# Patient Record
Sex: Male | Born: 1955 | Race: Black or African American | Hispanic: No | Smoking: Never smoker
Health system: Southern US, Community
[De-identification: ages and names within clinical notes are randomized; demographics above are authoritative.]

## PROBLEM LIST (undated history)

## (undated) DIAGNOSIS — I10 Essential (primary) hypertension: Secondary | ICD-10-CM

## (undated) DIAGNOSIS — C61 Malignant neoplasm of prostate: Secondary | ICD-10-CM

## (undated) HISTORY — PX: OTHER SURGICAL HISTORY: SHX169

## (undated) HISTORY — PX: PROSTATE BIOPSY: SHX241

## (undated) HISTORY — PX: EXTRACORPOREAL SHOCK WAVE LITHOTRIPSY: SHX1557

---

## 1998-07-02 ENCOUNTER — Encounter: Payer: Self-pay | Admitting: Internal Medicine

## 1998-07-02 ENCOUNTER — Ambulatory Visit (HOSPITAL_COMMUNITY): Admission: RE | Admit: 1998-07-02 | Discharge: 1998-07-02 | Payer: Self-pay | Admitting: Internal Medicine

## 1999-04-22 ENCOUNTER — Encounter: Payer: Self-pay | Admitting: Internal Medicine

## 1999-04-22 ENCOUNTER — Encounter: Admission: RE | Admit: 1999-04-22 | Discharge: 1999-04-22 | Payer: Self-pay | Admitting: Internal Medicine

## 2000-05-05 ENCOUNTER — Ambulatory Visit (HOSPITAL_COMMUNITY): Admission: RE | Admit: 2000-05-05 | Discharge: 2000-05-05 | Payer: Self-pay | Admitting: Urology

## 2000-12-06 ENCOUNTER — Encounter: Admission: RE | Admit: 2000-12-06 | Discharge: 2000-12-06 | Payer: Self-pay | Admitting: Internal Medicine

## 2000-12-06 ENCOUNTER — Encounter: Payer: Self-pay | Admitting: Internal Medicine

## 2002-10-28 ENCOUNTER — Encounter: Payer: Self-pay | Admitting: Emergency Medicine

## 2002-10-28 ENCOUNTER — Emergency Department (HOSPITAL_COMMUNITY): Admission: EM | Admit: 2002-10-28 | Discharge: 2002-10-28 | Payer: Self-pay | Admitting: Emergency Medicine

## 2009-11-14 ENCOUNTER — Encounter: Admission: RE | Admit: 2009-11-14 | Discharge: 2009-11-14 | Payer: Self-pay | Admitting: Internal Medicine

## 2009-11-26 ENCOUNTER — Emergency Department (HOSPITAL_COMMUNITY): Admission: EM | Admit: 2009-11-26 | Discharge: 2009-11-26 | Payer: Self-pay | Admitting: Emergency Medicine

## 2010-04-07 LAB — URINE CULTURE
Culture  Setup Time: 201111021402
Culture: NO GROWTH

## 2010-04-07 LAB — URINALYSIS, ROUTINE W REFLEX MICROSCOPIC
Bilirubin Urine: NEGATIVE
Glucose, UA: 500 mg/dL — AB
Leukocytes, UA: NEGATIVE
Nitrite: NEGATIVE
Protein, ur: NEGATIVE mg/dL
Specific Gravity, Urine: 1.024 (ref 1.005–1.030)
Urobilinogen, UA: 0.2 mg/dL (ref 0.0–1.0)
pH: 6 (ref 5.0–8.0)

## 2010-04-07 LAB — URINE MICROSCOPIC-ADD ON

## 2016-02-20 DIAGNOSIS — C61 Malignant neoplasm of prostate: Secondary | ICD-10-CM | POA: Diagnosis not present

## 2016-02-20 DIAGNOSIS — E782 Mixed hyperlipidemia: Secondary | ICD-10-CM | POA: Diagnosis not present

## 2016-02-20 DIAGNOSIS — E139 Other specified diabetes mellitus without complications: Secondary | ICD-10-CM | POA: Diagnosis not present

## 2016-02-20 DIAGNOSIS — I10 Essential (primary) hypertension: Secondary | ICD-10-CM | POA: Diagnosis not present

## 2016-02-20 DIAGNOSIS — Z0001 Encounter for general adult medical examination with abnormal findings: Secondary | ICD-10-CM | POA: Diagnosis not present

## 2016-02-20 DIAGNOSIS — D81818 Other biotin-dependent carboxylase deficiency: Secondary | ICD-10-CM | POA: Diagnosis not present

## 2016-02-20 DIAGNOSIS — E559 Vitamin D deficiency, unspecified: Secondary | ICD-10-CM | POA: Diagnosis not present

## 2016-02-20 DIAGNOSIS — Z125 Encounter for screening for malignant neoplasm of prostate: Secondary | ICD-10-CM | POA: Diagnosis not present

## 2016-02-20 DIAGNOSIS — Z79899 Other long term (current) drug therapy: Secondary | ICD-10-CM | POA: Diagnosis not present

## 2016-03-18 DIAGNOSIS — E139 Other specified diabetes mellitus without complications: Secondary | ICD-10-CM | POA: Diagnosis not present

## 2016-03-18 DIAGNOSIS — C61 Malignant neoplasm of prostate: Secondary | ICD-10-CM | POA: Diagnosis not present

## 2016-03-18 DIAGNOSIS — E782 Mixed hyperlipidemia: Secondary | ICD-10-CM | POA: Diagnosis not present

## 2016-03-18 DIAGNOSIS — I10 Essential (primary) hypertension: Secondary | ICD-10-CM | POA: Diagnosis not present

## 2016-04-13 DIAGNOSIS — E119 Type 2 diabetes mellitus without complications: Secondary | ICD-10-CM | POA: Diagnosis not present

## 2016-08-02 DIAGNOSIS — R972 Elevated prostate specific antigen [PSA]: Secondary | ICD-10-CM | POA: Diagnosis not present

## 2016-08-02 DIAGNOSIS — C61 Malignant neoplasm of prostate: Secondary | ICD-10-CM | POA: Diagnosis not present

## 2016-08-02 DIAGNOSIS — Z87442 Personal history of urinary calculi: Secondary | ICD-10-CM | POA: Diagnosis not present

## 2016-09-16 DIAGNOSIS — E139 Other specified diabetes mellitus without complications: Secondary | ICD-10-CM | POA: Diagnosis not present

## 2016-09-16 DIAGNOSIS — N200 Calculus of kidney: Secondary | ICD-10-CM | POA: Diagnosis not present

## 2016-09-16 DIAGNOSIS — I10 Essential (primary) hypertension: Secondary | ICD-10-CM | POA: Diagnosis not present

## 2016-09-16 DIAGNOSIS — E782 Mixed hyperlipidemia: Secondary | ICD-10-CM | POA: Diagnosis not present

## 2016-10-28 DIAGNOSIS — Z23 Encounter for immunization: Secondary | ICD-10-CM | POA: Diagnosis not present

## 2017-01-31 DIAGNOSIS — C61 Malignant neoplasm of prostate: Secondary | ICD-10-CM | POA: Diagnosis not present

## 2017-02-07 DIAGNOSIS — R972 Elevated prostate specific antigen [PSA]: Secondary | ICD-10-CM | POA: Diagnosis not present

## 2017-02-07 DIAGNOSIS — C61 Malignant neoplasm of prostate: Secondary | ICD-10-CM | POA: Diagnosis not present

## 2017-02-07 DIAGNOSIS — N200 Calculus of kidney: Secondary | ICD-10-CM | POA: Diagnosis not present

## 2017-02-08 ENCOUNTER — Other Ambulatory Visit: Payer: Self-pay | Admitting: Urology

## 2017-02-08 DIAGNOSIS — C61 Malignant neoplasm of prostate: Secondary | ICD-10-CM

## 2017-02-24 DIAGNOSIS — Z79899 Other long term (current) drug therapy: Secondary | ICD-10-CM | POA: Diagnosis not present

## 2017-02-24 DIAGNOSIS — E559 Vitamin D deficiency, unspecified: Secondary | ICD-10-CM | POA: Diagnosis not present

## 2017-02-24 DIAGNOSIS — Z0000 Encounter for general adult medical examination without abnormal findings: Secondary | ICD-10-CM | POA: Diagnosis not present

## 2017-02-24 DIAGNOSIS — E782 Mixed hyperlipidemia: Secondary | ICD-10-CM | POA: Diagnosis not present

## 2017-02-24 DIAGNOSIS — D81818 Other biotin-dependent carboxylase deficiency: Secondary | ICD-10-CM | POA: Diagnosis not present

## 2017-02-24 DIAGNOSIS — I10 Essential (primary) hypertension: Secondary | ICD-10-CM | POA: Diagnosis not present

## 2017-02-24 DIAGNOSIS — M109 Gout, unspecified: Secondary | ICD-10-CM | POA: Diagnosis not present

## 2017-02-24 DIAGNOSIS — E139 Other specified diabetes mellitus without complications: Secondary | ICD-10-CM | POA: Diagnosis not present

## 2017-03-14 ENCOUNTER — Other Ambulatory Visit: Payer: Self-pay

## 2017-03-21 ENCOUNTER — Other Ambulatory Visit: Payer: Self-pay | Admitting: Urology

## 2017-03-21 ENCOUNTER — Ambulatory Visit
Admission: RE | Admit: 2017-03-21 | Discharge: 2017-03-21 | Disposition: A | Discharge status: Home / Self Care | Payer: 59 | Source: Ambulatory Visit | Attending: Urology | Admitting: Urology

## 2017-03-21 DIAGNOSIS — S40859A Superficial foreign body of unspecified upper arm, initial encounter: Secondary | ICD-10-CM

## 2017-03-21 DIAGNOSIS — Z135 Encounter for screening for eye and ear disorders: Secondary | ICD-10-CM | POA: Diagnosis not present

## 2017-03-21 DIAGNOSIS — C61 Malignant neoplasm of prostate: Secondary | ICD-10-CM

## 2017-03-21 MED ORDER — GADOBENATE DIMEGLUMINE 529 MG/ML IV SOLN
20.0000 mL | Freq: Once | INTRAVENOUS | Status: AC | PRN
  Administered 2017-03-21: 20 mL via INTRAVENOUS

## 2017-05-10 DIAGNOSIS — R109 Unspecified abdominal pain: Secondary | ICD-10-CM | POA: Diagnosis not present

## 2017-06-02 DIAGNOSIS — C61 Malignant neoplasm of prostate: Secondary | ICD-10-CM | POA: Diagnosis not present

## 2017-06-13 ENCOUNTER — Encounter: Payer: Self-pay | Admitting: Medical Oncology

## 2017-06-13 ENCOUNTER — Telehealth: Payer: Self-pay | Admitting: Medical Oncology

## 2017-06-13 NOTE — Telephone Encounter (Signed)
Left message requesting a return call to discuss referral to the Prostate [place].

## 2017-06-13 NOTE — Telephone Encounter (Signed)
Mr. Carlos Weber returned my call regarding referral to the Prostate [place]. I introduced myself as the Prostate Nurse Navigator and the Coordinator of the Prostate [place].  1. I confirmed with the patient he is aware of his referral to the clinic 06/21/17. He states he received a call from Dr. Ralene Muskrat office Friday but he has not return the call so he is sure is was to inform him of referral. I asked him to arrive at 12:30 pm.   2. I discussed the format of the clinic and the physicians he will be seeing that day.  3. I discussed where the clinic is located and how to contact me.  4. I confirmed his address and informed him I would be mailing a packet of information and forms to be completed. I asked him to bring them with him the day of his appointment.   He voiced understanding of the above. I asked him to call me if he has any questions or concerns regarding his appointments or the forms he needs to complete.

## 2017-06-17 ENCOUNTER — Encounter: Payer: Self-pay | Admitting: Radiation Oncology

## 2017-06-17 ENCOUNTER — Other Ambulatory Visit: Payer: Self-pay | Admitting: Nurse Practitioner

## 2017-06-17 ENCOUNTER — Ambulatory Visit
Admission: RE | Admit: 2017-06-17 | Discharge: 2017-06-17 | Disposition: A | Discharge status: Home / Self Care | Payer: 59 | Source: Ambulatory Visit | Attending: Nurse Practitioner | Admitting: Nurse Practitioner

## 2017-06-17 DIAGNOSIS — M542 Cervicalgia: Secondary | ICD-10-CM

## 2017-06-17 DIAGNOSIS — I10 Essential (primary) hypertension: Secondary | ICD-10-CM | POA: Diagnosis not present

## 2017-06-17 NOTE — Progress Notes (Signed)
GU Location of Tumor / Histology: prostatic adenocarcinoma  If Prostate Cancer, Gleason Score is (4 + 3) and PSA is (12.3). Prostate volume: 28 grams.  OWYN RAULSTON has been under active surveillance since 2012.  Biopsies of prostate (if applicable) revealed:    Past/Anticipated interventions by urology, if any: prostate biopsy, active surveillance, prostate biopsy, referral to [facility]  Past/Anticipated interventions by medical oncology, if any: no  Weight changes, if any: no  Bowel/Bladder complaints, if any: IPSS 4  Nausea/Vomiting, if any: no  Pain issues, if any:  No   SAFETY ISSUES:  Prior radiation? no  Pacemaker/ICD? no  Possible current pregnancy? no  Is the patient on methotrexate? no  Current Complaints / other details:  62 year old male. Divorced. Work as a Production manager.

## 2017-06-21 ENCOUNTER — Encounter: Payer: Self-pay | Admitting: Medical Oncology

## 2017-06-21 ENCOUNTER — Ambulatory Visit
Admission: RE | Admit: 2017-06-21 | Discharge: 2017-06-21 | Disposition: A | Discharge status: Home / Self Care | Payer: 59 | Source: Ambulatory Visit | Attending: Radiation Oncology | Admitting: Radiation Oncology

## 2017-06-21 ENCOUNTER — Other Ambulatory Visit: Payer: Self-pay

## 2017-06-21 ENCOUNTER — Inpatient Hospital Stay: Payer: 59 | Attending: Oncology | Admitting: Oncology

## 2017-06-21 ENCOUNTER — Encounter: Payer: Self-pay | Admitting: Radiation Oncology

## 2017-06-21 ENCOUNTER — Telehealth: Payer: Self-pay | Admitting: Medical Oncology

## 2017-06-21 ENCOUNTER — Encounter: Payer: Self-pay | Admitting: General Practice

## 2017-06-21 DIAGNOSIS — Z79899 Other long term (current) drug therapy: Secondary | ICD-10-CM | POA: Diagnosis not present

## 2017-06-21 DIAGNOSIS — Z7982 Long term (current) use of aspirin: Secondary | ICD-10-CM | POA: Insufficient documentation

## 2017-06-21 DIAGNOSIS — Z7984 Long term (current) use of oral hypoglycemic drugs: Secondary | ICD-10-CM | POA: Diagnosis not present

## 2017-06-21 DIAGNOSIS — C61 Malignant neoplasm of prostate: Secondary | ICD-10-CM | POA: Insufficient documentation

## 2017-06-21 DIAGNOSIS — R972 Elevated prostate specific antigen [PSA]: Secondary | ICD-10-CM | POA: Diagnosis not present

## 2017-06-21 DIAGNOSIS — I10 Essential (primary) hypertension: Secondary | ICD-10-CM | POA: Insufficient documentation

## 2017-06-21 HISTORY — DX: Essential (primary) hypertension: I10

## 2017-06-21 HISTORY — DX: Malignant neoplasm of prostate: C61

## 2017-06-21 NOTE — Telephone Encounter (Signed)
Spoke with Carlos Weber to remind him of appointment for [facility]state [facility] today arriving at 12:30 pm. I reviewed the location, valet parking, registration and reminded him to bring his completed medical forms. He voiced understanding.

## 2017-06-21 NOTE — Progress Notes (Signed)
[place] Psychosocial Distress Screening Spiritual Care  Met with Marcello Moores and Renard Matter in [facility] to introduce [facility] team/resources, reviewing distress screen per protocol.  The patient scored a [unspecified] on the Psychosocial Distress Thermometer which indicates [unspecified] distress. Also assessed for distress and other psychosocial needs.   ONCBCN DISTRESS SCREENING 06/21/2017  Screening Type Initial Screening  Referral to support programs Yes    Mr Arnesen reviewed distress screen but did not complete his paper copy. Per pt, he likes to ask all questions before making a decision. He appeared anxious in facial affect and vibrating physical motions.  Normalized feelings of distress and fear/concern about unknown. Encouraged Prostate Cancer Support Group as a means for him and SO to meet other patients/partners who are coping with prostate ca and various treatment paths/experiences. Encouraged support team/programming resources.   Follow up needed: No. Per couple, no other needs at this time, but please page if immediate needs arise or circumstances change. Thank you.   [place], [place], [facility] Pager [phone] Voicemail [phone]

## 2017-06-21 NOTE — Progress Notes (Signed)
                               Care Plan Summary  Name: Mr. Carlos Weber DOB: [birth year]   Your Medical Team:   Urologist -  Dr. Raynelle Bring, [facility]  Radiation Oncologist - Dr. Tyler Pita, [facility]   Medical Oncologist - Dr. Zola Button, [place]  Recommendations: 1) Robotic prostatectomy 2) Brachytherapy (seed implant) 3) Radiation  * These recommendations are based on information available as of today's consult.      Recommendations may change depending on the results of further tests or exams.   Next Steps: 1) Consider your options and call Cira Rue, RN with your decision   When appointments need to be scheduled, you will be contacted by [facility] and/or [facility].  Provided with business cards for all team members and a copy of "Fall [place] Patient Safety Plan".  Questions?  Please do not hesitate to call Cira Rue, RN, BSN, OCN at [phone] any questions or concerns.  Shirlean Mylar is your Oncology Nurse Navigator and is available to assist you while you're receiving your medical care at [facility].

## 2017-06-21 NOTE — Progress Notes (Signed)
Radiation Oncology         [phone] ________________________________  Multidisciplinary Prostate Cancer Clinic  Initial Radiation Oncology Consultation  Name: Carlos Weber MRN: [id number]  Date: 06/21/2017  DOB: [birth year]  UV:[id number], Carlos [place], MD  Raynelle Bring, MD   REFERRING PHYSICIAN: Raynelle Bring, MD  DIAGNOSIS: 62 y.o. gentleman with stage T1c adenocarcinoma of the prostate with a Gleason's score of 4+3 and a PSA of 12.30    ICD-10-CM   1. Malignant neoplasm of prostate ([place]) C61     HISTORY OF PRESENT ILLNESS::Carlos Weber is a 62 y.o. gentleman.  He is an established patient of Dr. Irine Seal followed in active surveillance for a Gleason 3+3, T1c adenocarcinoma of the prostate originally diagnosed in 2012 with a PSA of 4.8.  He has remained compliant on active surveillance since the time of his diagnosis and has had a PSA as high as 11.51 in January 2018 but declined further evaluation with MRI which was recommended at that time.  A repeat PSA in July 2018 had fallen to 9.28 but a recent PSA in January 2019 was further elevated at 12.30.  Therefore, the patient proceeded to MRI of the prostate on 03/21/2017 for further evaluation and this revealed a 2.3 cm PI-RADS 5 lesion at the right apex.  The lesion appears to abut the capsule but there is no definite evidence of extraprostatic extension, seminal vesicle involvement or metastatic disease.  The patient proceeded to MRI fusion transrectal ultrasound with 12 biopsies of the prostate on 06/02/17.  The prostate volume measured 28 cc.  Out of 12 core biopsies,3 were positive.  The maximum Gleason score was 4+3, and this was seen in the right apex.  Additionally, there was Gleason 3+4 disease in the right mid lateral and right apex lateral..  The patient reviewed the biopsy results with his urologist and he has kindly been referred today to the multidisciplinary prostate cancer clinic for presentation of pathology and radiology  studies in our conference for discussion of potential radiation treatment options and clinical evaluation.  PREVIOUS RADIATION THERAPY: No  PAST MEDICAL HISTORY:  has a past medical history of Hypertension and Prostate cancer ([place]).    PAST SURGICAL HISTORY: Past Surgical History:  Procedure Laterality Date  . EXTRACORPOREAL SHOCK WAVE LITHOTRIPSY    . kidney stents    . PROSTATE BIOPSY    . PROSTATE BIOPSY      FAMILY HISTORY: family history is not on file.  SOCIAL HISTORY:  reports that he has never smoked. He has never used smokeless tobacco. He reports that he drinks alcohol. He reports that he does not use drugs.  ALLERGIES: Amoxicillin; Polycitra-k [potassium citrate]; Sulfa antibiotics; Toprol xl [metoprolol tartrate]; Vicodin [hydrocodone-acetaminophen]; and Zetia [ezetimibe]  MEDICATIONS:  Current Outpatient Medications  Medication Sig Dispense Refill  . aspirin EC 81 MG tablet Take 81 mg by mouth daily.    . Cholecalciferol (VITAMIN D3) 2000 units TABS Take by mouth.    Mar[place] Kitchen lisinopril (PRINIVIL,ZESTRIL) 5 MG tablet Take 5 mg by mouth daily.    . metFORMIN (GLUCOPHAGE) 500 MG tablet Take 500 mg by mouth 2 (two) times daily.  2  . cyclobenzaprine (FLEXERIL) 10 MG tablet 1 TABLET AS NEEDED THREE TIMES A DAY ORALLY 5 DAYS  0  . predniSONE (STERAPRED UNI-PAK 21 TAB) 10 MG (21) TBPK tablet TAKE 6 TABLETS ON DAY 1 AS DIRECTED ON PACKAGE AND DECREASE BY 1 TAB EACH DAY FOR A TOTAL OF 6 DAYS  0  No current facility-administered medications for this encounter.     REVIEW OF SYSTEMS:  On review of systems, the patient reports that he is doing well overall. He denies any chest pain, shortness of breath, cough, fevers, chills, night sweats, unintended weight changes. He denies any bowel disturbances, and denies abdominal pain, nausea or vomiting. He denies any new musculoskeletal or joint aches or pains. His IPSS was 4, indicating mild urinary symptoms. He is able to complete sexual  activity with most all attempts. A complete review of systems is obtained and is otherwise negative.   PHYSICAL EXAM:  Wt Readings from Last 3 Encounters:  06/21/17 226 lb (102.5 kg)   Temp Readings from Last 3 Encounters:  06/21/17 98.3 F (36.8 C) (Oral)   BP Readings from Last 3 Encounters:  06/21/17 (!) 159/97   Pulse Readings from Last 3 Encounters:  06/21/17 96   Pain Assessment Pain Score: 0-No pain/10  In general this is a well appearing Afican American male in no acute distress. He is alert and oriented x4 and appropriate throughout the examination. HEENT reveals that the patient is normocephalic, atraumatic. EOMs are intact. PERRLA. Skin is intact without any evidence of gross lesions. Cardiovascular exam reveals a regular rate and rhythm, no clicks rubs or murmurs are auscultated. Chest is clear to auscultation bilaterally. Lymphatic assessment is performed and does not reveal any adenopathy in the cervical, supraclavicular, axillary, or inguinal chains. Abdomen has active bowel sounds in all quadrants and is intact. The abdomen is soft, non tender, non distended. Lower extremities are negative for pretibial pitting edema, deep calf tenderness, cyanosis or clubbing.  KPS = 100  100 - Normal; no complaints; no evidence of disease. 90   - Able to carry on normal activity; minor signs or symptoms of disease. 80   - Normal activity with effort; some signs or symptoms of disease. 19   - Cares for self; unable to carry on normal activity or to do active work. 60   - Requires occasional assistance, but is able to care for most of his personal needs. 50   - Requires considerable assistance and frequent medical care. 58   - Disabled; requires special care and assistance. 41   - Severely disabled; hospital admission is indicated although death not imminent. 25   - Very sick; hospital admission necessary; active supportive treatment necessary. 10   - Moribund; fatal processes  progressing rapidly. 0     - Dead  Karnofsky DA, Abelmann WH, Craver LS and Burchenal JH [phone]) The use of the nitrogen mustards in the palliative treatment of carcinoma: with particular reference to bronchogenic carcinoma Cancer 1 [phone]   LABORATORY DATA:  No results found for: WBC, HGB, HCT, MCV, PLT No results found for: NA, K, CL, CO2 No results found for: ALT, AST, GGT, ALKPHOS, BILITOT   RADIOGRAPHY: Dg Cervical Spine 2 Or 3 Views  Result Date: 06/18/2017 CLINICAL DATA:  Neck pain for several days.  No injury. EXAM: CERVICAL SPINE - 2-3 VIEW COMPARISON:  None. FINDINGS: No fracture, bone lesion or spondylolisthesis. Straightening of the normal cervical lordosis. Disc spaces are well maintained. Anterior bridging osteophytes noted at C4-C5, C5-C6 and partly at C6-C7. There are facet degenerative changes in mid to upper cervical spine, most prominent on the left at C3-C4. Soft tissues are unremarkable. IMPRESSION: 1. No fracture, bone lesion or spondylolisthesis. 2. Degenerative changes as described. Electronically Signed   By: Lajean Manes M.D.   On: 06/18/2017 12:40  IMPRESSION/PLAN: 62 y.o. gentleman with an unfavorable intermediate risk, stage T1c adenocarcinoma of the prostate with a PSA of 12.3 and a Gleason score of 4+3.   We discussed the patient's workup and outlines the nature of prostate cancer in this setting. The patient's T stage, Gleason's score, and PSA put him into the unfavorable intermediate risk group. Accordingly, he is eligible for a variety of potential treatment options including brachytherapy or 5.5 - 8 weeks of external radiation. We discussed the available radiation techniques, and focused on the details and logistics and delivery. We discussed and outlined the risks, benefits, short and long-term effects associated with radiotherapy and compared and contrasted these with prostatectomy. We discussed the role of SpaceOAR in reducing the rectal toxicity associated  with radiotherapy. We also detailed the role of ADT in the treatment of unfavorable risk prostate cancer and outlined the associated side effects that could be expected with this therapy.  At the end of the conversation the patient remains undecided regarding his final treatment preference and requests some additional time to consider his options before committing to a plan.  He appears to be leaning towards either prostatectomy or ST-ADT in combination with brachytherapy at this time.  He plans to reach a decision within the next 1-2 weeks and will call with his preference.  He knows to call at any time with any questions or concerns.  We enjoyed meeting him and his wife and would be happy to continue to participate in the care of this nice gentleman should he elect to proceed with radiotherapy.    More than 50% of today's visit was spent in counseling and/or coordination of care.     Nicholos Johns, PA-C    Tyler Pita, MD  [facility] Direct Dial: [phone]  Fax: [phone] [email or web address]  Skype  LinkedIn

## 2017-06-21 NOTE — Progress Notes (Signed)
Reason for Referral: Prostate cancer  HPI: 62 year old gentleman with history of prostate cancer dates back to 2012.  At that time his PSA was 4.48 and a Gleason score of 3+3 equal 6.  He went on to active surveillance for period of time and had a repeat biopsy in 2013.  He did not have any other follow-up until his recent PSA was up to 10.6 in May 2019.  At that time he underwent a repeat biopsy by Dr. Junious Silk after an MRI was done in February 2019.  The MRI showed 2.3 x 1.1 x 1.6 cm lesion along the anterior horn of the right apex of the peripheral gland suspicious for high-grade tumor.  A repeat biopsy confirmed the presence of 2 cores of 3+4 = 7 prostate cancer on the right side.  And one core of 4+3 equal 7 the apex.  He is asymptomatic from this finding and denies any frequency, urgency or hematuria.  He denies any bone pain or pathological fractures.  He did have an episode of neck pain last week and had a cervical spine x-ray and did not show any abnormalities.  He remains active continues to work full-time.  He does not report any headaches, blurry vision, syncope or seizures. Does not report any fevers, chills or sweats.  Does not report any cough, wheezing or hemoptysis.  Does not report any chest pain, palpitation, orthopnea or leg edema.  Does not report any nausea, vomiting or abdominal pain.  Does not report any constipation or diarrhea.  Does not report any skeletal complaints.    Does not report frequency, urgency or hematuria.  Does not report any skin rashes or lesions. Does not report any heat or cold intolerance.  Does not report any lymphadenopathy or petechiae.  Does not report any anxiety or depression.  Remaining review of systems is negative.    Past Medical History:  Diagnosis Date  . Hypertension   . Prostate cancer [facility])   :  Past Surgical History:  Procedure Laterality Date  . EXTRACORPOREAL SHOCK WAVE LITHOTRIPSY    . kidney stents    . PROSTATE BIOPSY    .  PROSTATE BIOPSY    :   Current Outpatient Medications:  .  aspirin EC 81 MG tablet, Take 81 mg by mouth daily., Disp: , Rfl:  .  Cholecalciferol (VITAMIN D3) 2000 units TABS, Take by mouth., Disp: , Rfl:  .  cyclobenzaprine (FLEXERIL) 10 MG tablet, 1 TABLET AS NEEDED THREE TIMES A DAY ORALLY 5 DAYS, Disp: , Rfl: 0 .  lisinopril (PRINIVIL,ZESTRIL) 5 MG tablet, Take 5 mg by mouth daily., Disp: , Rfl:  .  metFORMIN (GLUCOPHAGE) 500 MG tablet, Take 500 mg by mouth 2 (two) times daily., Disp: , Rfl: 2 .  predniSONE (STERAPRED UNI-PAK 21 TAB) 10 MG (21) TBPK tablet, TAKE 6 TABLETS ON DAY 1 AS DIRECTED ON PACKAGE AND DECREASE BY 1 TAB EACH DAY FOR A TOTAL OF 6 DAYS, Disp: , Rfl: 0:  Allergies  Allergen Reactions  . Amoxicillin   . Polycitra-K [Potassium Citrate]   . Sulfa Antibiotics   . Toprol Xl [Metoprolol Tartrate]   . Vicodin [Hydrocodone-Acetaminophen]   . Zetia [Ezetimibe]   :  No family history on file.:  Social History   Socioeconomic History  . Marital status: Divorced    Spouse name: Mee Hives  . Number of children: 2  . Years of education: Not on file  . Highest education level: Not on file  Occupational  History    Comment: Corporate treasurer Needs  . Financial resource strain: Not on file  . Food insecurity:    Worry: Not on file    Inability: Not on file  . Transportation needs:    Medical: Not on file    Non-medical: Not on file  Tobacco Use  . Smoking status: Never Smoker  . Smokeless tobacco: Never Used  Substance and Sexual Activity  . Alcohol use: Yes  . Drug use: Never  . Sexual activity: Yes  Lifestyle  . Physical activity:    Days per week: Not on file    Minutes per session: Not on file  . Stress: Not on file  Relationships  . Social connections:    Talks on phone: Not on file    Gets together: Not on file    Attends religious service: Not on file    Active member of club or organization: Not on file    Attends meetings of  clubs or organizations: Not on file    Relationship status: Not on file  . Intimate partner violence:    Fear of current or ex partner: Not on file    Emotionally abused: Not on file    Physically abused: Not on file    Forced sexual activity: Not on file  Other Topics Concern  . Not on file  Social History Narrative  . Not on file  :  Pertinent items are noted in HPI.  Exam: ECOG 0 General appearance: alert and cooperative appeared without distress. Head: atraumatic without any abnormalities. Eyes: conjunctivae/corneas clear. PERRL.  Sclera anicteric. Throat: lips, mucosa, and tongue normal; without oral thrush or ulcers. Resp: clear to auscultation bilaterally without rhonchi, wheezes or dullness to percussion. Cardio: regular rate and rhythm, S1, S2 normal, no murmur, click, rub or gallop GI: soft, non-tender; bowel sounds normal; no masses,  no organomegaly Skin: Skin color, texture, turgor normal. No rashes or lesions Lymph nodes: Cervical, supraclavicular, and axillary nodes normal. Neurologic: Grossly normal without any motor, sensory or deep tendon reflexes. Musculoskeletal: No joint deformity or effusion.    Dg Cervical Spine 2 Or 3 Views  Result Date: 06/18/2017 CLINICAL DATA:  Neck pain for several days.  No injury. EXAM: CERVICAL SPINE - 2-3 VIEW COMPARISON:  None. FINDINGS: No fracture, bone lesion or spondylolisthesis. Straightening of the normal cervical lordosis. Disc spaces are well maintained. Anterior bridging osteophytes noted at C4-C5, C5-C6 and partly at C6-C7. There are facet degenerative changes in mid to upper cervical spine, most prominent on the left at C3-C4. Soft tissues are unremarkable. IMPRESSION: 1. No fracture, bone lesion or spondylolisthesis. 2. Degenerative changes as described. Electronically Signed   By: Lajean Manes M.D.   On: 06/18/2017 12:40  EXAM: MR PROSTATE WITHOUT AND WITH CONTRAST  TECHNIQUE: Multiplanar multisequence MRI images  were obtained of the pelvis centered about the prostate. Pre and post contrast images were obtained.  CONTRAST:  53mL MULTIHANCE GADOBENATE DIMEGLUMINE 529 MG/ML IV SOLN  COMPARISON:  None.  FINDINGS: Prostate: 2.3 x 1.1 x 1.6 cm low T2 lesion along the anterior horn of the right apex of the peripheral gland (series 7/image 12). Lesion abuts the anterior fibromuscular stroma without definite transgression. Associated early arterial enhancement (series 20/image 11). Associated restricted diffusion/low ADC, particularly along the left lateral/medial border (series 11/image 13). This appearance is worrisome for high-grade macroscopic prostate cancer. PI-RADS 5.  Enlargement/nodularity central gland, suggesting BPH. No suspicious central gland nodule on T2.  Volume:  4.7 x 3.7 x 3.2 cm (calculated volume 28.56 mL)  Transcapsular spread: Lesion abuts the anterior fibromuscular stroma without definite transgression.  Seminal vesicle involvement: Absent.  Neurovascular bundle involvement: Absent.  Pelvic adenopathy: Absent.  Bone metastasis: Absent.  Other findings: None.  IMPRESSION: 2.3 cm lesion along the anterior horn of the right apex of the peripheral gland, worrisome for high-grade macroscopic prostate cancer, as described above. PI-RADS 5.  Lesion abuts the anterior fibromuscular stroma without definite transgression. No evidence of seminal vesicle invasion or metastatic disease.   Assessment and Plan:   62 year old gentleman with the following issues:  1.  Prostate cancer diagnosed in 2012 with Gleason score 3+3 equal 6 and a PSA 4.48.  He has been on active surveillance since that time and most recent PSA was up to 10.6 and developed a Gleason score 3+4 = 7 and 2 cores and 4+3 equal 7 and one core in the right side.  These findings correlated with an MRI findings of a lesion on the right apex of the peripheral gland.  The natural course of this  disease was discussed today and his case was reviewed in the prostate cancer multidisciplinary clinic.  His pathology specimen was discussed with the reviewing pathologist and his MRI was reviewed with radiology.  Treatment options were reviewed today which include primary surgical therapy versus radiation therapy and short-term androgen deprivation.  Complication associated with both therapies were reviewed as well as the complications associated with androgen deprivation.  The treatment goal is curative for both of those choices and he is a excellent candidate for both approaches.  He understands without any curative therapy, he is at risk of developing recurrent disease and metastatic disease would be incurable.  All his questions were answered today to his satisfaction.  He will consider both options and sites in the near future.  2.  Neck pain: Unrelated to his prostate cancer.  Imaging studies did not show any abnormalities in his cervical spine.  30  minutes was spent with the patient face-to-face today.  More than 50% of time was dedicated to patient counseling, education and coordination of his care.

## 2017-06-21 NOTE — Consult Note (Signed)
[facility]     06/21/2017   --------------------------------------------------------------------------------   Marcello Moores L. [place]  MRN: [id number]  PRIMARY CARE:  Ria Bush, MD  DOB: 1955-06-29, 62 year old Male  REFERRING:    SSN:-**[id number]  PROVIDER:  Irine Seal, M.D.    TREATING:  Raynelle Bring, M.D.    LOCATION:  [facility] [phone]   --------------------------------------------------------------------------------   CC/HPI: CC: Prostate Cancer   Physician requesting consult: Dr. Irine Seal  PCP: Dr. Marcellus Scott  Location of consult: [facility] - Pro[facility] Cancer [facility]   Mr. Gallier is a 62 year old gentleman who was initially diagnosed with low risk prostate cancer in February 2012. His PSA was elevated at 4.48 and he underwent a TRUS biopsy on 03/18/10 that confirmed Gleason 3+3=6 adenocarcinoma with 3 out of 12 biopsy cores positive. A surveillance biopsy in February 2013 confirmed similar findings with 2 out of 12 biopsy cores positive for Gleason 6 prostate cancer. He has refused additional evaluation with biopsies or MR imaging until his PSA continued to increase finally reaching 12.3 in January 2019. An MRI of the prostate on 03/21/17 demonstrated a 2.3 cm PI-RADS 5 lesion in the right anterior apex and an MR/US fusion biopsy on 06/02/17 confirmed Gleason 4+3=7 disease including 4 out of 4 positive MR targeted biopsies and 3 out of 12 systematic biopsies positive for malignancy.   Family history: None.   Imaging studies: MRI (03/22/27) - No EPE, SVI. or LAD.   PMH: He has a history of hyperlipidemia and diabetes.  PSH: No abdominal surgeries.   TNM stage: cT1c N0 Mx  PSA: 10.8  Gleason score: 4+3=7  Biopsy (03/21/17): 7/16 cores positive  Left: Benign  Right: R apex (60%, 4+3=7), R lateral apex (60%, 3+4=7), R mid (50%, 3+4=7)  MR target: 4/4 cores (80%, 80%, 60%, 5%, 3+4=7)  Prostate volume: 28 cc    Nomogram  OC disease: 28%  EPE: 69%  SVI: 9%  LNI: 10%  PFS (5 year, 10 year): 56%, 40%   Urinary function: IPSS is 4.  Erectile function: SHIM score is 25.     ALLERGIES: Amoxicillin TABS Polycitra-K PACK Sulfa Drugs Toprol XL TB24 Vicodin TABS Zetia TABS    MEDICATIONS: Aspirin 81 MG TABS Oral  Crestor 5 MG Oral Tablet 0 Oral  Diazepam 10 mg tablet 1-2 tablet PO Daily Take one hour prior to procedure.  Metformin Hcl 500 mg tablet Oral  Vitamin D2 50,000 unit capsule Oral     GU PSH: ESWL - 2011, 2011 Prostate Needle Biopsy - 06/02/2017, 2013      [place] Notes: Biopsy Of The Prostate Needle, Lithotripsy, Lithotripsy   NON-GU PSH: Surgical Pathology, Gross And Microscopic Examination For Prostate Needle - 06/02/2017    GU PMH: Prostate Cancer - 06/02/2017, His PSA continues rise and it has been almost 6 years since his last biopsy. I have recommended an MRIP with fusion or regular biopsy depending on the MRI. , - 02/07/2017, His PSA came back down after going over 10 and his f/u has been delayed. I will get a PSA and if stable, in 6 months. His exam remains benign. , - 08/02/2016, Prostate cancer, - 2017 Renal calculus, He has had no recent stone symptoms. - 02/07/2017, Bilateral kidney stones, - 2017 History of urolithiasis (Stable), He has no hematuria or stone symptoms. - 08/02/2016, Nephrolithiasis, - 2014 Elevated PSA, Elevated prostate specific antigen (PSA) - 2017 Low back pain, Lumbago -  05-15-2012 Personal Hx Oth male genital organs diseases, History of acute prostatitis - 05-15-12 Ureteral calculus, Ureteral Stone - May 15, 2012    NON-GU PMH: Encounter for general adult medical examination without abnormal findings, Encounter for preventive health examination - May 16, 2015 Allergy status to unspecified drugs, medicaments and biological substances status, Allergies - 15-May-2012 Glycosuria, Glycosuria - May 15, 2012 Personal history of other diseases of the circulatory system, History of hypertension -  05-15-12 Personal history of other endocrine, nutritional and metabolic disease, History of hyperlipidemia - 05-15-2012, History of diabetes mellitus, - 05/15/12    FAMILY HISTORY: Death In The Family Father - Runs In Family Death In The Family Mother - Runs In Family Family Health Status Number - Runs In Family   SOCIAL HISTORY: Marital Status: Single Preferred Language: English 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     Notes: Occupation:, Never A Smoker, Alcohol Use, Tobacco Use, Caffeine Use, Marital History - Divorced   REVIEW OF SYSTEMS:    GU Review Male:   Patient denies frequent urination, hard to postpone urination, burning/ pain with urination, get up at night to urinate, leakage of urine, stream starts and stops, trouble starting your streams, and have to strain to urinate .  Gastrointestinal (Upper):   Patient denies nausea and vomiting.  Gastrointestinal (Lower):   Patient denies diarrhea and constipation.  Constitutional:   Patient denies fever, night sweats, weight loss, and fatigue.  Skin:   Patient denies skin rash/ lesion and itching.  Eyes:   Patient denies blurred vision and double vision.  Ears/ Nose/ Throat:   Patient denies sore throat and sinus problems.  Hematologic/Lymphatic:   Patient denies swollen glands and easy bruising.  Cardiovascular:   Patient denies leg swelling and chest pains.  Respiratory:   Patient denies cough and shortness of breath.  Endocrine:   Patient denies excessive thirst.  Musculoskeletal:   Patient denies back pain and joint pain.  Neurological:   Patient denies headaches and dizziness.  Psychologic:   Patient denies depression and anxiety.   VITAL SIGNS: None   MULTI-SYSTEM PHYSICAL EXAMINATION:    Constitutional: Well-nourished. No physical deformities. Normally developed. Good grooming.     PAST DATA REVIEWED:  Source Of History:  Patient  Lab Test Review:   PSA  Records Review:   Pathology Reports, Previous Patient Records   X-Ray Review: MRI Prostate GSORAD: Reviewed Films.     06/02/17 01/31/17 08/02/16 02/20/16 04/30/15 04/15/15 09/03/14 10/05/13  PSA  Total PSA 10.80 ng/mL 12.30 ng/mL 9.28 ng/mL 11.51 ng/dl 8.64  10.27  8.14  7.51   Free PSA        0.35   % Free PSA    5 %    5     PROCEDURES: None   ASSESSMENT:      ICD-10 Details  1 GU:   Prostate Cancer - C61    PLAN:           Document Letter(s):  Created for Patient: Clinical Summary         Notes:   1. Prostate cancer: I had a long and detailed discussion with Mr. Goostree and his wife today regarding his prostate cancer situation. Specifically, we discussed the fact that his cancer has progressed and how this changes the natural history of his prostate cancer. Considering his age and life expectancy, I did recommend therapy of curative intent. The patient was counseled about the natural history of prostate cancer and the standard treatment options that are available for prostate  cancer. It was explained to him how his age and life expectancy, clinical stage, Gleason score, and PSA affect his prognosis, the decision to proceed with additional staging studies, as well as how that information influences recommended treatment strategies. We discussed the roles for active surveillance, radiation therapy, surgical therapy, androgen deprivation, as well as ablative therapy options for the treatment of prostate cancer as appropriate to his individual cancer situation. We discussed the risks and benefits of these options with regard to their impact on cancer control and also in terms of potential adverse events, complications, and impact on quality of life particularly related to urinary and sexual function. The patient was encouraged to ask questions throughout the discussion today and all questions were answered to his stated satisfaction. In addition, the patient was provided with and/or directed to appropriate resources and literature for further education  about prostate cancer and treatment options.   We discussed surgical therapy for prostate cancer including the different available surgical approaches. We discussed, in detail, the risks and expectations of surgery with regard to cancer control, urinary control, and erectile function as well as the expected postoperative recovery process. Additional risks of surgery including but not limited to bleeding, infection, hernia formation, nerve damage, lymphocele formation, bowel/rectal injury potentially necessitating colostomy, damage to the urinary tract resulting in urine leakage, urethral stricture, and the cardiopulmonary risks such as myocardial infarction, stroke, death, venothromboembolism, etc. were explained. The risk of open surgical conversion for robotic/laparoscopic prostatectomy was also discussed.   He is scheduled to see both Dr. Tammi Klippel and Dr. Alen Blew later today. He will notify me if he does wish to consider surgical treatment. I tentative plan would be to perform a bilateral nerve-sparing robot assisted laparoscopic radical prostatectomy and pelvic lymphadenectomy If so, I will need to see him for a physical exam prior to surgery.   CC: Dr. Irine Seal  Dr. Marcellus Scott    E & M CODE: I spent at least 68 minutes face to face with the patient, more than 50% of that time was spent on counseling and/or coordinating care.

## 2017-06-22 ENCOUNTER — Telehealth: Payer: Self-pay

## 2017-06-22 NOTE — Telephone Encounter (Signed)
Per 5/28 no los 

## 2017-06-27 DIAGNOSIS — I10 Essential (primary) hypertension: Secondary | ICD-10-CM | POA: Diagnosis not present

## 2017-06-27 DIAGNOSIS — M542 Cervicalgia: Secondary | ICD-10-CM | POA: Diagnosis not present

## 2017-06-28 ENCOUNTER — Telehealth: Payer: Self-pay | Admitting: Medical Oncology

## 2017-06-28 NOTE — Telephone Encounter (Signed)
Left a message as follow up to Prostate MDC. I asked him to call with his treatment decision or if he has questions or concerns regarding treatment options.

## 2017-07-05 DIAGNOSIS — M542 Cervicalgia: Secondary | ICD-10-CM | POA: Diagnosis not present

## 2017-07-08 ENCOUNTER — Encounter: Payer: Self-pay | Admitting: Medical Oncology

## 2017-07-11 ENCOUNTER — Telehealth: Payer: Self-pay | Admitting: Medical Oncology

## 2017-07-11 NOTE — Progress Notes (Signed)
Returned a call to Carlos Weber regarding prostate support group. He states he has not a treatment decision. He would like to attend the prostate support group and meet others who have under gone a variety of treatments. I informed him our next meeting is June 17 at 6 pm. He voiced understanding and looks forward to the meeting.

## 2017-07-11 NOTE — Telephone Encounter (Signed)
Opened in error

## 2017-07-18 DIAGNOSIS — C61 Malignant neoplasm of prostate: Secondary | ICD-10-CM | POA: Diagnosis not present

## 2017-07-18 DIAGNOSIS — I10 Essential (primary) hypertension: Secondary | ICD-10-CM | POA: Diagnosis not present

## 2017-07-18 DIAGNOSIS — M542 Cervicalgia: Secondary | ICD-10-CM | POA: Diagnosis not present

## 2017-07-19 ENCOUNTER — Telehealth: Payer: Self-pay | Admitting: Medical Oncology

## 2017-07-19 NOTE — Telephone Encounter (Signed)
Spoke with Carlos Weber regarding treatment decision for prostate cancer. He states he has not made his final decision. He did attend the prostate support group and found this very helpful. I asked him to call me when he has reached his decision and I will forward this information to Dr. Tammi Klippel. He voiced understanding.

## 2017-08-10 DIAGNOSIS — R972 Elevated prostate specific antigen [PSA]: Secondary | ICD-10-CM | POA: Diagnosis not present

## 2017-08-10 DIAGNOSIS — C61 Malignant neoplasm of prostate: Secondary | ICD-10-CM | POA: Diagnosis not present

## 2017-08-24 DIAGNOSIS — M542 Cervicalgia: Secondary | ICD-10-CM | POA: Diagnosis not present

## 2017-08-24 DIAGNOSIS — E119 Type 2 diabetes mellitus without complications: Secondary | ICD-10-CM | POA: Diagnosis not present

## 2017-08-24 DIAGNOSIS — I10 Essential (primary) hypertension: Secondary | ICD-10-CM | POA: Diagnosis not present

## 2017-08-24 DIAGNOSIS — Z79899 Other long term (current) drug therapy: Secondary | ICD-10-CM | POA: Diagnosis not present

## 2017-09-01 ENCOUNTER — Telehealth: Payer: Self-pay | Admitting: Medical Oncology

## 2017-09-01 NOTE — Telephone Encounter (Signed)
Left message requesting a return call to discuss his treatment decision. He was seen in the [facility] 5/28 and was undecided of treatment at that time.

## 2017-10-18 DIAGNOSIS — E139 Other specified diabetes mellitus without complications: Secondary | ICD-10-CM | POA: Diagnosis not present

## 2017-10-18 DIAGNOSIS — E119 Type 2 diabetes mellitus without complications: Secondary | ICD-10-CM | POA: Diagnosis not present

## 2017-10-18 DIAGNOSIS — I10 Essential (primary) hypertension: Secondary | ICD-10-CM | POA: Diagnosis not present

## 2017-10-18 DIAGNOSIS — M542 Cervicalgia: Secondary | ICD-10-CM | POA: Diagnosis not present

## 2017-10-18 DIAGNOSIS — C61 Malignant neoplasm of prostate: Secondary | ICD-10-CM | POA: Diagnosis not present

## 2017-11-21 DIAGNOSIS — E119 Type 2 diabetes mellitus without complications: Secondary | ICD-10-CM | POA: Diagnosis not present

## 2017-11-23 DIAGNOSIS — R972 Elevated prostate specific antigen [PSA]: Secondary | ICD-10-CM | POA: Diagnosis not present

## 2017-11-23 DIAGNOSIS — C61 Malignant neoplasm of prostate: Secondary | ICD-10-CM | POA: Diagnosis not present

## 2017-12-09 DIAGNOSIS — C61 Malignant neoplasm of prostate: Secondary | ICD-10-CM | POA: Diagnosis not present

## 2017-12-27 DIAGNOSIS — N401 Enlarged prostate with lower urinary tract symptoms: Secondary | ICD-10-CM | POA: Diagnosis not present

## 2017-12-28 DIAGNOSIS — R310 Gross hematuria: Secondary | ICD-10-CM | POA: Diagnosis not present

## 2017-12-28 DIAGNOSIS — N23 Unspecified renal colic: Secondary | ICD-10-CM | POA: Diagnosis not present

## 2017-12-28 DIAGNOSIS — R972 Elevated prostate specific antigen [PSA]: Secondary | ICD-10-CM | POA: Diagnosis not present

## 2017-12-28 DIAGNOSIS — N201 Calculus of ureter: Secondary | ICD-10-CM | POA: Diagnosis not present

## 2017-12-29 ENCOUNTER — Encounter
Admission: RE | Disposition: A | Discharge status: Home / Self Care | Payer: Self-pay | Source: Ambulatory Visit | Attending: Urology

## 2017-12-29 ENCOUNTER — Ambulatory Visit
Admission: RE | Admit: 2017-12-29 | Discharge: 2017-12-29 | Disposition: A | Discharge status: Home / Self Care | Payer: 59 | Source: Ambulatory Visit | Attending: Urology | Admitting: Urology

## 2017-12-29 DIAGNOSIS — Z79899 Other long term (current) drug therapy: Secondary | ICD-10-CM | POA: Insufficient documentation

## 2017-12-29 DIAGNOSIS — Z87442 Personal history of urinary calculi: Secondary | ICD-10-CM | POA: Insufficient documentation

## 2017-12-29 DIAGNOSIS — E119 Type 2 diabetes mellitus without complications: Secondary | ICD-10-CM | POA: Insufficient documentation

## 2017-12-29 DIAGNOSIS — Z8546 Personal history of malignant neoplasm of prostate: Secondary | ICD-10-CM | POA: Insufficient documentation

## 2017-12-29 DIAGNOSIS — N201 Calculus of ureter: Secondary | ICD-10-CM

## 2017-12-29 DIAGNOSIS — I10 Essential (primary) hypertension: Secondary | ICD-10-CM | POA: Diagnosis not present

## 2017-12-29 DIAGNOSIS — Z7982 Long term (current) use of aspirin: Secondary | ICD-10-CM | POA: Insufficient documentation

## 2017-12-29 DIAGNOSIS — N23 Unspecified renal colic: Secondary | ICD-10-CM | POA: Diagnosis not present

## 2017-12-29 HISTORY — PX: EXTRACORPOREAL SHOCK WAVE LITHOTRIPSY: SHX1557

## 2017-12-29 SURGERY — LITHOTRIPSY, ESWL
Anesthesia: Moderate Sedation | Laterality: Left

## 2017-12-29 MED ORDER — FUROSEMIDE 10 MG/ML IJ SOLN
10.0000 mg | Freq: Once | INTRAMUSCULAR | Status: AC
  Administered 2017-12-29: 10 mg via INTRAVENOUS

## 2017-12-29 MED ORDER — FUROSEMIDE 10 MG/ML IJ SOLN
INTRAMUSCULAR | Status: AC
  Filled 2017-12-29: qty 2

## 2017-12-29 MED ORDER — MORPHINE SULFATE (PF) 10 MG/ML IV SOLN
10.0000 mg | Freq: Once | INTRAVENOUS | Status: AC
  Administered 2017-12-29: 10 mg via INTRAMUSCULAR

## 2017-12-29 MED ORDER — DOCUSATE SODIUM 100 MG PO CAPS: 200.0000 mg | ORAL_CAPSULE | Freq: Two times a day (BID) | ORAL | 3 refills | Status: AC

## 2017-12-29 MED ORDER — MORPHINE SULFATE (PF) 10 MG/ML IV SOLN
INTRAVENOUS | Status: AC
  Administered 2017-12-29: 10 mg via INTRAMUSCULAR
  Filled 2017-12-29: qty 1

## 2017-12-29 MED ORDER — MIDAZOLAM HCL 2 MG/2ML IJ SOLN
INTRAMUSCULAR | Status: AC
  Administered 2017-12-29: 1 mg via INTRAMUSCULAR
  Filled 2017-12-29: qty 2

## 2017-12-29 MED ORDER — PROMETHAZINE HCL 25 MG/ML IJ SOLN
INTRAMUSCULAR | Status: AC
  Administered 2017-12-29: 25 mg via INTRAMUSCULAR
  Filled 2017-12-29: qty 1

## 2017-12-29 MED ORDER — PROMETHAZINE HCL 25 MG/ML IJ SOLN
25.0000 mg | Freq: Once | INTRAMUSCULAR | Status: AC
  Administered 2017-12-29: 25 mg via INTRAMUSCULAR

## 2017-12-29 MED ORDER — DIPHENHYDRAMINE HCL 25 MG PO CAPS
25.0000 mg | ORAL_CAPSULE | Freq: Once | ORAL | Status: AC
  Administered 2017-12-29: 25 mg via ORAL

## 2017-12-29 MED ORDER — ONDANSETRON 8 MG PO TBDP: 8.0000 mg | ORAL_TABLET | Freq: Four times a day (QID) | ORAL | 3 refills | Status: AC | PRN

## 2017-12-29 MED ORDER — DEXTROSE-NACL 5-0.45 % IV SOLN
INTRAVENOUS | Status: DC
  Administered 2017-12-29: 13:00:00 via INTRAVENOUS

## 2017-12-29 MED ORDER — CIPROFLOXACIN HCL 500 MG PO TABS: 500.0000 mg | ORAL_TABLET | Freq: Two times a day (BID) | ORAL | 0 refills | Status: AC

## 2017-12-29 MED ORDER — LEVOFLOXACIN 500 MG PO TABS
ORAL_TABLET | ORAL | Status: AC
  Administered 2017-12-29: 500 mg via ORAL
  Filled 2017-12-29: qty 1

## 2017-12-29 MED ORDER — LEVOFLOXACIN 500 MG PO TABS
500.0000 mg | ORAL_TABLET | Freq: Once | ORAL | Status: AC
  Administered 2017-12-29: 500 mg via ORAL

## 2017-12-29 MED ORDER — OXYCODONE-ACETAMINOPHEN 10-325 MG PO TABS: 1.0000 | ORAL_TABLET | ORAL | 0 refills | Status: AC | PRN

## 2017-12-29 MED ORDER — DIPHENHYDRAMINE HCL 25 MG PO CAPS
ORAL_CAPSULE | ORAL | Status: AC
  Administered 2017-12-29: 25 mg via ORAL
  Filled 2017-12-29: qty 1

## 2017-12-29 MED ORDER — MIDAZOLAM HCL 2 MG/2ML IJ SOLN
1.0000 mg | Freq: Once | INTRAMUSCULAR | Status: AC
  Administered 2017-12-29: 1 mg via INTRAMUSCULAR

## 2017-12-29 NOTE — Discharge Instructions (Addendum)
AMBULATORY SURGERY  DISCHARGE INSTRUCTIONS   1) The drugs that you were given will stay in your system until tomorrow so for the next 24 hours you should not:  A) Drive an automobile B) Make any legal decisions C) Drink any alcoholic beverage   2) You may resume regular meals tomorrow.  Today it is better to start with liquids and gradually work up to solid foods.  You may eat anything you prefer, but it is better to start with liquids, then soup and crackers, and gradually work up to solid foods.   3) Please notify your doctor immediately if you have any unusual bleeding, trouble breathing, redness and pain at the surgery site, drainage, fever, or pain not relieved by medication. 4)   5) Your post-operative visit with Dr.                                     is: Date:                        Time:    Please call to schedule your post-operative visit.  6) Additional Instructions:       Dietary Guidelines to Help Prevent Kidney Stones Kidney stones are deposits of minerals and salts that form inside your kidneys. Your risk of developing kidney stones may be greater depending on your diet, your lifestyle, the medicines you take, and whether you have certain medical conditions. Most people can reduce their chances of developing kidney stones by following the instructions below. Depending on your overall health and the type of kidney stones you tend to develop, your dietitian may give you more specific instructions. What are tips for following this plan? Reading food labels  Choose foods with "no salt added" or "low-salt" labels. Limit your sodium intake to less than 1500 mg per day.  Choose foods with calcium for each meal and snack. Try to eat about 300 mg of calcium at each meal. Foods that contain 200-500 mg of calcium per serving include: ? 8 oz (237 ml) of milk, fortified nondairy milk, and fortified fruit juice. ? 8 oz (237 ml) of kefir, yogurt, and soy yogurt. ? 4 oz (118  ml) of tofu. ? 1 oz of cheese. ? 1 cup (300 g) of dried figs. ? 1 cup (91 g) of cooked broccoli. ? 1-3 oz can of sardines or mackerel.  Most people need 1000 to 1500 mg of calcium each day. Talk to your dietitian about how much calcium is recommended for you. Shopping  Buy plenty of fresh fruits and vegetables. Most people do not need to avoid fruits and vegetables, even if they contain nutrients that may contribute to kidney stones.  When shopping for convenience foods, choose: ? Whole pieces of fruit. ? Premade salads with dressing on the side. ? Low-fat fruit and yogurt smoothies.  Avoid buying frozen meals or prepared deli foods.  Look for foods with live cultures, such as yogurt and kefir. Cooking  Do not add salt to food when cooking. Place a salt shaker on the table and allow each person to add his or her own salt to taste.  Use vegetable protein, such as beans, textured vegetable protein (TVP), or tofu instead of meat in pasta, casseroles, and soups. Meal planning  Eat less salt, if told by your dietitian. To do this: ? Avoid eating processed or premade food. ?  Avoid eating fast food.  Eat less animal protein, including cheese, meat, poultry, or fish, if told by your dietitian. To do this: ? Limit the number of times you have meat, poultry, fish, or cheese each week. Eat a diet free of meat at least 2 days a week. ? Eat only one serving each day of meat, poultry, fish, or seafood. ? When you prepare animal protein, cut pieces into small portion sizes. For most meat and fish, one serving is about the size of one deck of cards.  Eat at least 5 servings of fresh fruits and vegetables each day. To do this: ? Keep fruits and vegetables on hand for snacks. ? Eat 1 piece of fruit or a handful of berries with breakfast. ? Have a salad and fruit at lunch. ? Have two kinds of vegetables at dinner.  Limit foods that are high in a substance called oxalate. These  include: ? Spinach. ? Rhubarb. ? Beets. ? Potato chips and french fries. ? Nuts.  If you regularly take a diuretic medicine, make sure to eat at least 1-2 fruits or vegetables high in potassium each day. These include: ? Avocado. ? Banana. ? Orange, prune, carrot, or tomato juice. ? Baked potato. ? Cabbage. ? Beans and split peas. General instructions  Drink enough fluid to keep your urine clear or pale yellow. This is the most important thing you can do.  Talk to your health care provider and dietitian about taking daily supplements. Depending on your health and the cause of your kidney stones, you may be advised: ? Not to take supplements with vitamin C. ? To take a calcium supplement. ? To take a daily probiotic supplement. ? To take other supplements such as magnesium, fish oil, or vitamin B6.  Take all medicines and supplements as told by your health care provider.  Limit alcohol intake to no more than 1 drink a day for nonpregnant women and 2 drinks a day for men. One drink equals 12 oz of beer, 5 oz of wine, or 1 oz of hard liquor.  Lose weight if told by your health care provider. Work with your dietitian to find strategies and an eating plan that works best for you. What foods are not recommended? Limit your intake of the following foods, or as told by your dietitian. Talk to your dietitian about specific foods you should avoid based on the type of kidney stones and your overall health. Grains Breads. Bagels. Rolls. Baked goods. Salted crackers. Cereal. Pasta. Vegetables Spinach. Rhubarb. Beets. Canned vegetables. Angie Fava. Olives. Meats and other protein foods Nuts. Nut butters. Large portions of meat, poultry, or fish. Salted or cured meats. Deli meats. Hot dogs. Sausages. Dairy Cheese. Beverages Regular soft drinks. Regular vegetable juice. Seasonings and other foods Seasoning blends with salt. Salad dressings. Canned soups. Soy sauce. Ketchup. Barbecue sauce.  Canned pasta sauce. Casseroles. Pizza. Lasagna. Frozen meals. Potato chips. [place] fries. Summary  You can reduce your risk of kidney stones by making changes to your diet.  The most important thing you can do is drink enough fluid. You should drink enough fluid to keep your urine clear or pale yellow.  Ask your health care provider or dietitian how much protein from animal sources you should eat each day, and also how much salt and calcium you should have each day. This information is not intended to replace advice given to you by your health care provider. Make sure you discuss any questions you have with your  health care provider. Document Released: 05/08/2010 Document Revised: 12/23/2015 Document Reviewed: 12/23/2015 Elsevier Interactive Patient Education  2018 [employer].   Kidney Stones Kidney stones (urolithiasis) are rock-like masses that form inside of the kidneys. Kidneys are organs that make pee (urine). A kidney stone can cause very bad pain and can block the flow of pee. The stone usually leaves your body (passes) through your pee. You may need to have a doctor take out the stone. Follow these instructions at home: Eating and drinking  Drink enough fluid to keep your pee clear or pale yellow. This will help you pass the stone.  If told by your doctor, change the foods you eat (your diet). This may include: ? Limiting how much salt (sodium) you eat. ? Eating more fruits and vegetables. ? Limiting how much meat, poultry, fish, and eggs you eat.  Follow instructions from your doctor about eating or drinking restrictions. General instructions  Collect pee samples as told by your doctor. You may need to collect a pee sample: ? 24 hours after a stone comes out. ? 8-12 weeks after a stone comes out, and every 6-12 months after that.  Strain your pee every time you pee (urinate), for as long as told. Use the strainer that your doctor recommends.  Do not throw out the stone.  Keep it so that it can be tested by your doctor.  Take over-the-counter and prescription medicines only as told by your doctor.  Keep all follow-up visits as told by your doctor. This is important. You may need follow-up tests. Preventing kidney stones To prevent another kidney stone:  Drink enough fluid to keep your pee clear or pale yellow. This is the best way to prevent kidney stones.  Eat healthy foods.  Avoid certain foods as told by your doctor. You may be told to eat less protein.  Stay at a healthy weight.  Contact a doctor if:  You have pain that gets worse or does not get better with medicine. Get help right away if:  You have a fever or chills.  You get very bad pain.  You get new pain in your belly (abdomen).  You pass out (faint).  You cannot pee. This information is not intended to replace advice given to you by your health care provider. Make sure you discuss any questions you have with your health care provider. Document Released: 06/30/2007 Document Revised: 09/30/2015 Document Reviewed: 09/30/2015 Elsevier Interactive Patient Education  2017 [place].   Lithotripsy Lithotripsy is a treatment that can sometimes help eliminate kidney stones and the pain that they cause. A form of lithotripsy, also known as extracorporeal shock wave lithotripsy, is a nonsurgical procedure that crushes a kidney stone with shock waves. These shock waves pass through your body and focus on the kidney stone. They cause the kidney stone to break up while it is still in the urinary tract. This makes it easier for the smaller pieces of stone to pass in the urine. Tell a health care provider about:  Any allergies you have.  All medicines you are taking, including vitamins, herbs, eye drops, creams, and over-the-counter medicines.  Any blood disorders you have.  Any surgeries you have had.  Any medical conditions you have.  Whether you are pregnant or may be  pregnant.  Any problems you or family members have had with anesthetic medicines. What are the risks? Generally, this is a safe procedure. However, problems may occur, including:  Infection.  Bleeding of the  kidney.  Bruising of the kidney or skin.  Scarring of the kidney, which can lead to: ? Increased blood pressure. ? Poor kidney function. ? Return (recurrence) of kidney stones.  Damage to other structures or organs, such as the liver, colon, spleen, or pancreas.  Blockage (obstruction) of the the tube that carries urine from the kidney to the bladder (ureter).  Failure of the kidney stone to break into pieces (fragments).  What happens before the procedure? Staying hydrated Follow instructions from your health care provider about hydration, which may include:  Up to 2 hours before the procedure - you may continue to drink clear liquids, such as water, clear fruit juice, black coffee, and plain tea.  Eating and drinking restrictions Follow instructions from your health care provider about eating and drinking, which may include:  8 hours before the procedure - stop eating heavy meals or foods such as meat, fried foods, or fatty foods.  6 hours before the procedure - stop eating light meals or foods, such as toast or cereal.  6 hours before the procedure - stop drinking milk or drinks that contain milk.  2 hours before the procedure - stop drinking clear liquids.  General instructions  Plan to have someone take you home from the hospital or clinic.  Ask your health care provider about: ? Changing or stopping your regular medicines. This is especially important if you are taking diabetes medicines or blood thinners. ? Taking medicines such as aspirin and ibuprofen. These medicines and other NSAIDs can thin your blood. Do not take these medicines for 7 days before your procedure if your health care provider instructs you not to.  You may have tests, such as: ? Blood  tests. ? Urine tests. ? Imaging tests, such as a CT scan. What happens during the procedure?  To lower your risk of infection: ? Your health care team will wash or sanitize their hands. ? Your skin will be washed with soap.  An IV tube will be inserted into one of your veins. This tube will give you fluids and medicines.  You will be given one or more of the following: ? A medicine to help you relax (sedative). ? A medicine to make you fall asleep (general anesthetic).  A water-filled cushion may be placed behind your kidney or on your abdomen. In some cases you may be placed in a tub of lukewarm water.  Your body will be positioned in a way that makes it easy to target the kidney stone.  A flexible tube with holes in it (stent) may be placed in the ureter. This will help keep urine flowing from the kidney if the fragments of the stone have been blocking the ureter.  An X-ray or ultrasound exam will be done to locate your stone.  Shock waves will be aimed at the stone. If you are awake, you may feel a tapping sensation as the shock waves pass through your body. The procedure may vary among health care providers and hospitals. What happens after the procedure?  You may have an X-ray to see whether the procedure was able to break up the kidney stone and how much of the stone has passed. If large stone fragments remain after treatment, you may need to have a second procedure at a later time.  Your blood pressure, heart rate, breathing rate, and blood oxygen level will be monitored until the medicines you were given have worn off.  You may be given antibiotics or pain  medicine as needed.  If a stent was placed in your ureter during surgery, it may stay in place for a few weeks.  You may need strain your urine to collect pieces of the kidney stone for testing.  You will need to drink plenty of water.  Do not drive for 24 hours if you were given a sedative. Summary  Lithotripsy is  a treatment that can sometimes help eliminate kidney stones and the pain that they cause.  A form of lithotripsy, also known as extracorporeal shock wave lithotripsy, is a nonsurgical procedure that crushes a kidney stone with shock waves.  Generally, this is a safe procedure. However, problems may occur, including damage to the kidney or other organs, infection, or obstruction of the tube that carries urine from the kidney to the bladder (ureter).  When you go home, you will need to drink plenty of water. You may be asked to strain your urine to collect pieces of the kidney stone for testing. This information is not intended to replace advice given to you by your health care provider. Make sure you discuss any questions you have with your health care provider. Document Released: 01/09/2000 Document Revised: 12/03/2015 Document Reviewed: 12/03/2015 Elsevier Interactive Patient Education  2018 [place] After This sheet gives you information about how to care for yourself after your procedure. Your health care provider may also give you more specific instructions. If you have problems or questions, contact your health care provider. What can I expect after the procedure? After the procedure, it is common to have:  Some blood in your urine. This should only last for a few days.  Soreness in your back, sides, or upper abdomen for a few days.  Blotches or bruises on your back where the pressure wave entered the skin.  Pain, discomfort, or nausea when pieces (fragments) of the kidney stone move through the tube that carries urine from the kidney to the bladder (ureter). Stone fragments may pass soon after the procedure, but they may continue to pass for up to 4-8 weeks. ? If you have severe pain or nausea, contact your health care provider. This may be caused by a large stone that was not broken up, and this may mean that you need more treatment.  Some pain or discomfort  during urination.  Some pain or discomfort in the lower abdomen or (in men) at the base of the penis.  Follow these instructions at home: Medicines  Take over-the-counter and prescription medicines only as told by your health care provider.  If you were prescribed an antibiotic medicine, take it as told by your health care provider. Do not stop taking the antibiotic even if you start to feel better.  Do not drive for 24 hours if you were given a medicine to help you relax (sedative).  Do not drive or use heavy machinery while taking prescription pain medicine. Eating and drinking  Drink enough water and fluids to keep your urine clear or pale yellow. This helps any remaining pieces of the stone to pass. It can also help prevent new stones from forming.  Eat plenty of fresh fruits and vegetables.  Follow instructions from your health care provider about eating and drinking restrictions. You may be instructed: ? To reduce how much salt (sodium) you eat or drink. Check ingredients and nutrition facts on packaged foods and beverages. ? To reduce how much meat you eat.  Eat the recommended amount of calcium for your  age and gender. Ask your health care provider how much calcium you should have. General instructions  Get plenty of rest.  Most people can resume normal activities 1-2 days after the procedure. Ask your health care provider what activities are safe for you.  If directed, strain all urine through the strainer that was provided by your health care provider. ? Keep all fragments for your health care provider to see. Any stones that are found may be sent to a medical lab for examination. The stone may be as small as a grain of salt.  Keep all follow-up visits as told by your health care provider. This is important. Contact a health care provider if:  You have pain that is severe or does not get better with medicine.  You have nausea that is severe or does not go away.  You  have blood in your urine longer than your health care provider told you to expect.  You have more blood in your urine.  You have pain during urination that does not go away.  You urinate more frequently than usual and this does not go away.  You develop a rash or any other possible signs of an allergic reaction. Get help right away if:  You have severe pain in your back, sides, or upper abdomen.  You have severe pain while urinating.  Your urine is very dark red.  You have blood in your stool (feces).  You cannot pass any urine at all.  You feel a strong urge to urinate after emptying your bladder.  You have a fever or chills.  You develop shortness of breath, difficulty breathing, or chest pain.  You have severe nausea that leads to persistent vomiting.  You faint. Summary  After this procedure, it is common to have some pain, discomfort, or nausea when pieces (fragments) of the kidney stone move through the tube that carries urine from the kidney to the bladder (ureter). If this pain or nausea is severe, however, you should contact your health care provider.  Most people can resume normal activities 1-2 days after the procedure. Ask your health care provider what activities are safe for you.  Drink enough water and fluids to keep your urine clear or pale yellow. This helps any remaining pieces of the stone to pass, and it can help prevent new stones from forming.  If directed, strain your urine and keep all fragments for your health care provider to see. Fragments or stones may be as small as a grain of salt.  Get help right away if you have severe pain in your back, sides, or upper abdomen or have severe pain while urinating. This information is not intended to replace advice given to you by your health care provider. Make sure you discuss any questions you have with your health care provider. Document Released: 01/31/2007 Document Revised: 12/03/2015 Document Reviewed:  12/03/2015 Elsevier Interactive Patient Education  [employer].

## 2017-12-30 ENCOUNTER — Encounter: Payer: Self-pay | Admitting: Urology

## 2018-01-10 ENCOUNTER — Telehealth: Payer: Self-pay | Admitting: Medical Oncology

## 2018-01-10 NOTE — Telephone Encounter (Signed)
Spoke with Carlos Weber regarding treatment for his prostate cancer.He states he was treated with [facility] but did not disclose where he was treated. He was seen in the [facility] in May but was undecided on treatment. I left him several messages requesting a return call to discuss his decision without a response. I wished him well and asked him to call if we can ever be of assistance. He voiced understanding.

## 2018-01-17 DIAGNOSIS — E119 Type 2 diabetes mellitus without complications: Secondary | ICD-10-CM | POA: Diagnosis not present

## 2018-01-17 DIAGNOSIS — E139 Other specified diabetes mellitus without complications: Secondary | ICD-10-CM | POA: Diagnosis not present

## 2018-02-02 DIAGNOSIS — C61 Malignant neoplasm of prostate: Secondary | ICD-10-CM | POA: Diagnosis not present

## 2018-02-02 DIAGNOSIS — N201 Calculus of ureter: Secondary | ICD-10-CM | POA: Diagnosis not present

## 2018-02-28 DIAGNOSIS — C61 Malignant neoplasm of prostate: Secondary | ICD-10-CM | POA: Diagnosis not present

## 2018-02-28 DIAGNOSIS — Z0000 Encounter for general adult medical examination without abnormal findings: Secondary | ICD-10-CM | POA: Diagnosis not present

## 2018-02-28 DIAGNOSIS — E782 Mixed hyperlipidemia: Secondary | ICD-10-CM | POA: Diagnosis not present

## 2018-02-28 DIAGNOSIS — E139 Other specified diabetes mellitus without complications: Secondary | ICD-10-CM | POA: Diagnosis not present

## 2018-04-06 DIAGNOSIS — N50811 Right testicular pain: Secondary | ICD-10-CM | POA: Diagnosis not present

## 2018-04-06 DIAGNOSIS — G4733 Obstructive sleep apnea (adult) (pediatric): Secondary | ICD-10-CM | POA: Diagnosis not present

## 2018-05-15 DIAGNOSIS — C61 Malignant neoplasm of prostate: Secondary | ICD-10-CM | POA: Diagnosis not present

## 2018-05-23 DIAGNOSIS — G4733 Obstructive sleep apnea (adult) (pediatric): Secondary | ICD-10-CM | POA: Diagnosis not present

## 2018-06-07 IMAGING — CR DG CERVICAL SPINE 2 OR 3 VIEWS
3 series · 3 of 3 positions shown · non-contrast
Comparison: None.

CLINICAL DATA: Neck pain for several days.  No injury.

EXAM:
CERVICAL SPINE - 2-3 VIEW

[w c-spine lat]
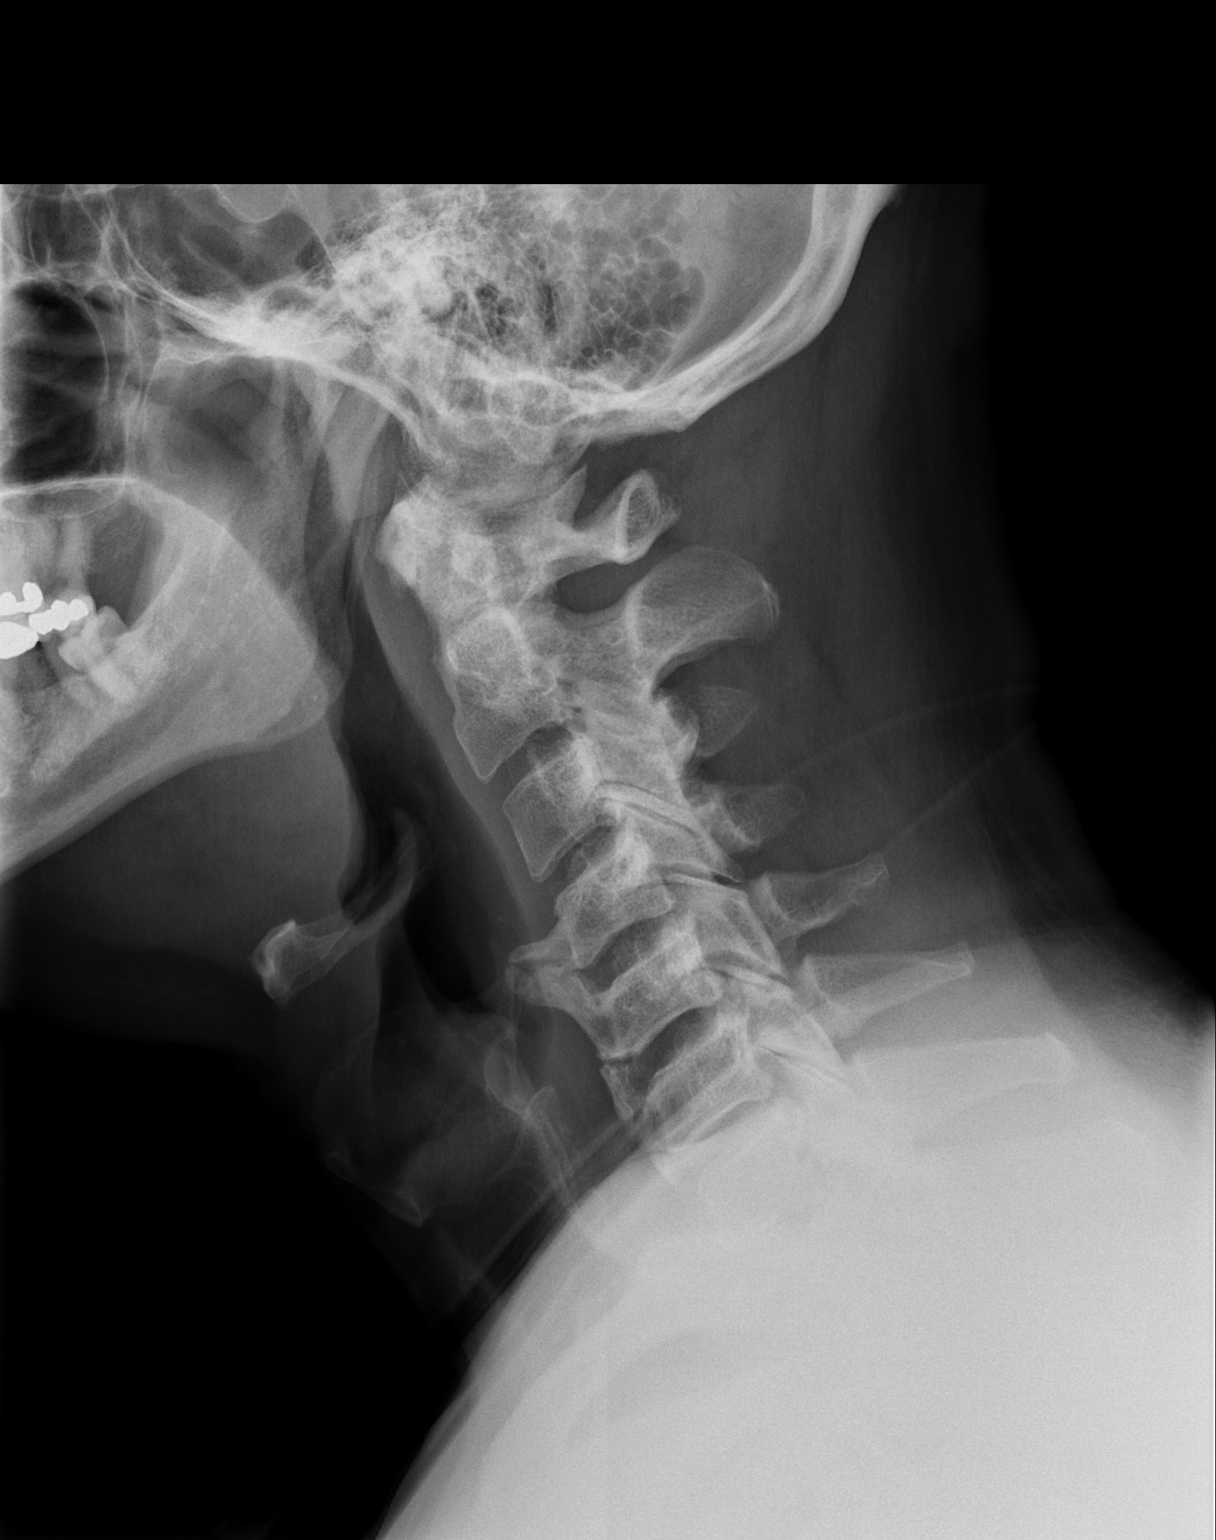

[w c-spine a.p. *]
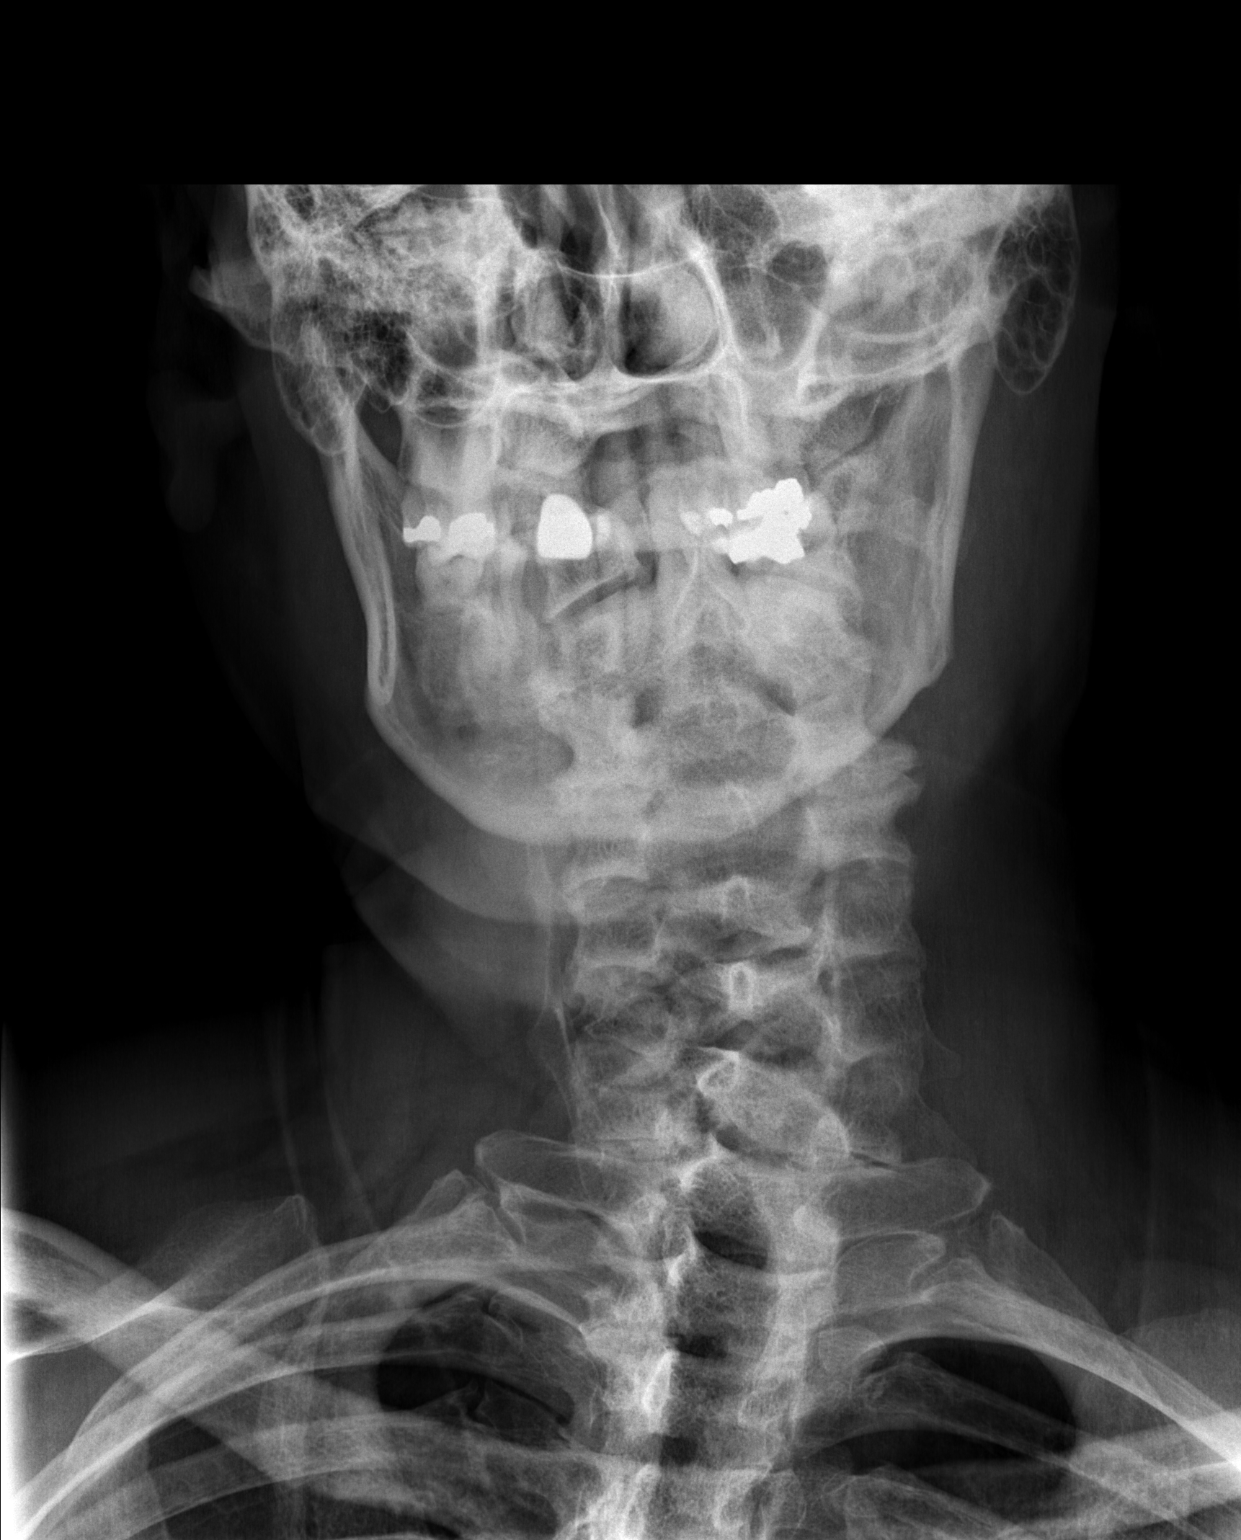

[w c-spine odontoid *]
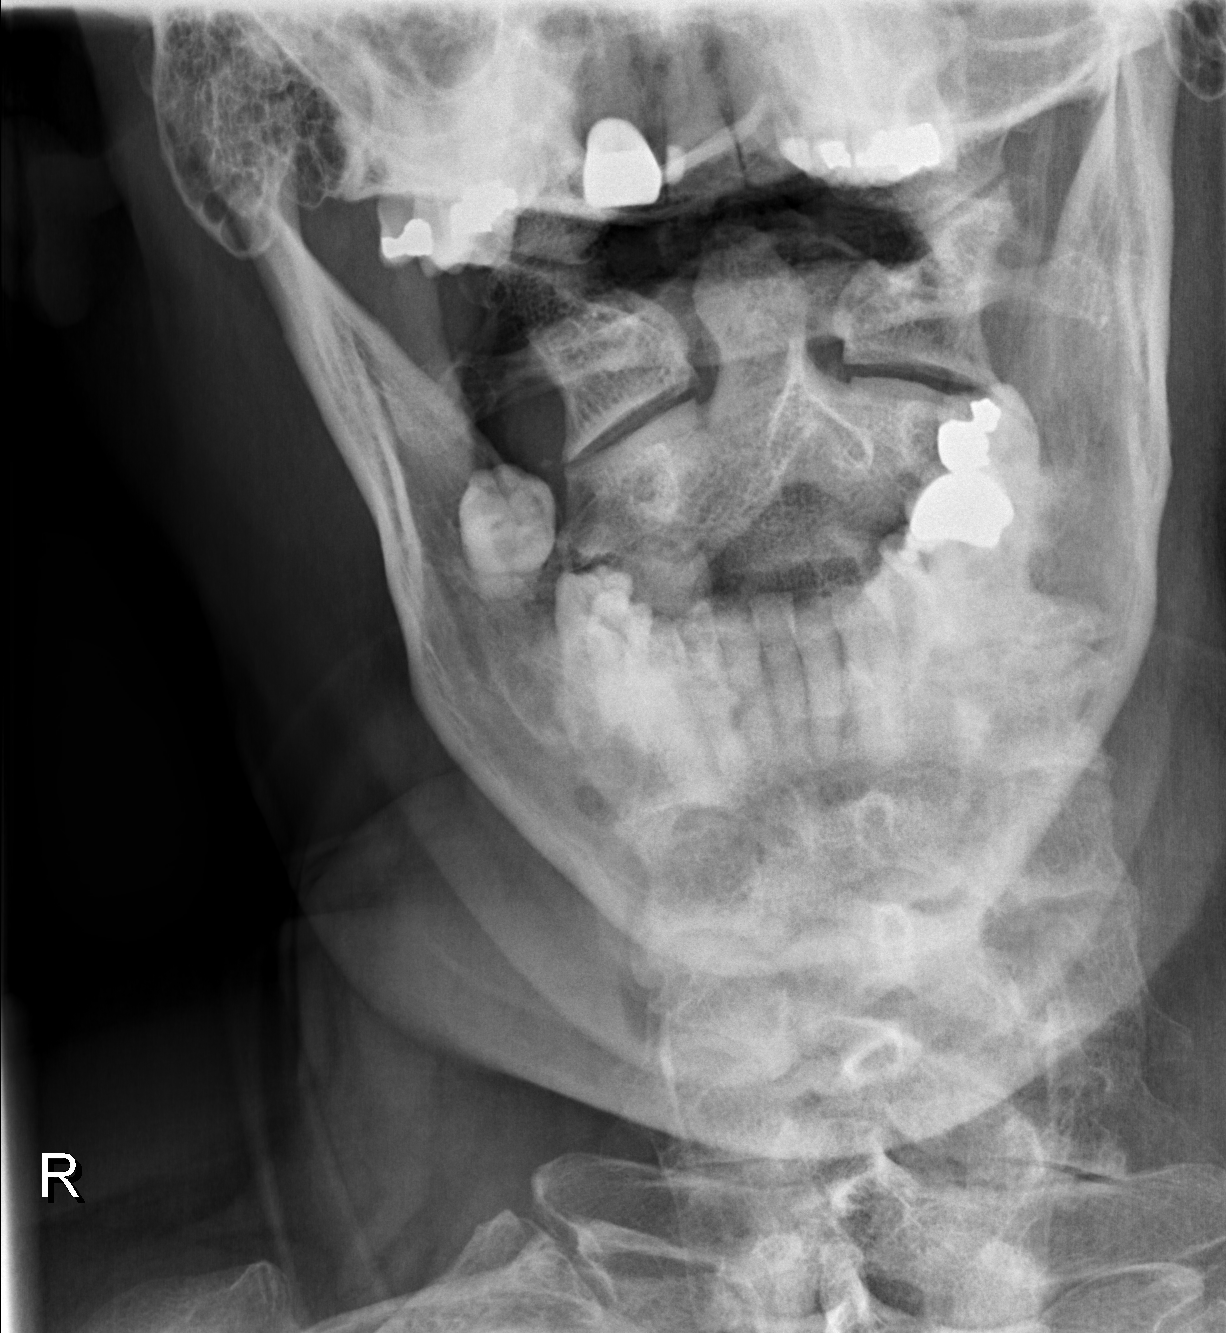

[3 of 3 positions shown; findings below may reference images not displayed]

FINDINGS: No fracture, bone lesion or spondylolisthesis.

Straightening of the normal cervical lordosis.

Disc spaces are well maintained. Anterior bridging osteophytes noted
at C4-C5, C5-C6 and partly at C6-C7. There are facet degenerative
changes in mid to upper cervical spine, most prominent on the left
at C3-C4.

Soft tissues are unremarkable.
IMPRESSION: 1. No fracture, bone lesion or spondylolisthesis.
2. Degenerative changes as described.

## 2018-06-22 DIAGNOSIS — G4733 Obstructive sleep apnea (adult) (pediatric): Secondary | ICD-10-CM | POA: Diagnosis not present

## 2019-04-09 ENCOUNTER — Ambulatory Visit: Payer: 59 | Attending: Internal Medicine

## 2019-04-09 DIAGNOSIS — Z23 Encounter for immunization: Secondary | ICD-10-CM

## 2019-04-09 NOTE — Progress Notes (Signed)
   Covid-19 Vaccination Clinic  Name:  Carlos Weber    MRN: [id number] DOB: 1955-02-23  04/09/2019  [name] was observed post Covid-19 immunization for 15 minutes without incident. He was provided with Vaccine Information Sheet and instruction to access the V-Safe system.   [name] was instructed to call 911 with any severe reactions post vaccine: Marland Kitchen Difficulty breathing  . Swelling of face and throat  . A fast heartbeat  . A bad rash all over body  . Dizziness and weakness   Immunizations Administered    Name Date Dose VIS Date Route   Pfizer COVID-19 Vaccine 04/09/2019  4:34 PM 0.3 mL 01/05/2019 Intramuscular   Manufacturer: [place]   Lot: [id number]   [place]: [id number]

## 2019-05-02 ENCOUNTER — Ambulatory Visit: Payer: 59 | Attending: Internal Medicine

## 2019-05-02 DIAGNOSIS — Z23 Encounter for immunization: Secondary | ICD-10-CM

## 2019-05-02 NOTE — Progress Notes (Signed)
   Covid-19 Vaccination Clinic  Name:  EVERSON PRESLEY    MRN: [id number] DOB: August 02, 1955  05/02/2019  [name] was observed post Covid-19 immunization for 15 minutes without incident. He was provided with Vaccine Information Sheet and instruction to access the V-Safe system.   [name] was instructed to call 911 with any severe reactions post vaccine: Marland Kitchen Difficulty breathing  . Swelling of face and throat  . A fast heartbeat  . A bad rash all over body  . Dizziness and weakness   Immunizations Administered    Name Date Dose VIS Date Route   Pfizer COVID-19 Vaccine 05/02/2019  4:47 PM 0.3 mL 01/05/2019 Intramuscular   Manufacturer: [place]   Lot: [id number]   [place]: [id number]

## 2020-01-11 ENCOUNTER — Ambulatory Visit: Payer: 59 | Attending: Internal Medicine

## 2020-01-11 DIAGNOSIS — Z23 Encounter for immunization: Secondary | ICD-10-CM

## 2020-01-11 NOTE — Progress Notes (Signed)
° °  Covid-19 Vaccination Clinic  Name:  Carlos Weber    MRN: [id number] DOB: 01/08/1956  01/11/2020  Mr. Bedwell was observed post Covid-19 immunization for 15 minutes without incident. He was provided with Vaccine Information Sheet and instruction to access the V-Safe system.   Mr. Rollo was instructed to call 911 with any severe reactions post vaccine:  Difficulty breathing   Swelling of face and throat   A fast heartbeat   A bad rash all over body   Dizziness and weakness   Immunizations Administered    Name Date Dose VIS Date Route   Pfizer COVID-19 Vaccine 01/11/2020  4:52 PM 0.3 mL 11/14/2019 Intramuscular   Manufacturer: [place]   Lot: [id number]   NDC: [id number]

## 2020-02-13 LAB — COLOGUARD: COLOGUARD: NEGATIVE

## 2022-08-12 ENCOUNTER — Encounter: Payer: Self-pay | Admitting: Sports Medicine

## 2022-08-12 ENCOUNTER — Ambulatory Visit: Payer: 59 | Admitting: Sports Medicine

## 2022-08-12 ENCOUNTER — Ambulatory Visit
Admission: RE | Admit: 2022-08-12 | Discharge: 2022-08-12 | Disposition: A | Discharge status: Home / Self Care | Payer: 59 | Source: Ambulatory Visit | Attending: Sports Medicine | Admitting: Sports Medicine

## 2022-08-12 VITALS — BP 116/76 | Ht 74.0 in | Wt 210.0 lb

## 2022-08-12 DIAGNOSIS — M25511 Pain in right shoulder: Secondary | ICD-10-CM | POA: Diagnosis not present

## 2022-08-12 NOTE — Progress Notes (Signed)
   Subjective:    Patient ID: Carlos Weber, male    DOB: 1955-05-14, 67 y.o.   MRN: [id number]  HPI chief complaint: Right shoulder pain  Patient is a very pleasant right-hand-dominant 67 year old male that presents today complaining of lateral right shoulder pain this been present for quite some time.  However, over the past 8 to 10 months, it has gotten worse.  Certain activities such as turning the wheel of his lawnmower or using his weed Johnell Comings will cause him pain.  He has also noticed some limited range of motion especially with reaching behind his back or away from his body.  He does endorse nighttime pain.  He has not noticed weakness other than weakness from pain.  No known trauma.  No prior shoulder surgeries.  No recent imaging.  He does state that a single 200 mg ibuprofen tablet does help quite a bit.  Past medical history reviewed Medications reviewed Allergies reviewed    Review of Systems As above    Objective:   Physical Exam  Well-developed, well-nourished.  No acute distress  Right shoulder: Patient has good forward flexion but abduction actively and passively is limited to about 100 degrees.  Internal rotation is limited to 50 degrees.  External rotation limited to 60 degrees.  No tenderness over the [facility] joint nor over the bicipital groove.  Positive empty can, positive Hawkins.  Rotator cuff strength is 5/5.  Negative O'Brien's.  Neurovascularly intact distally.      Assessment & Plan:   Right shoulder pain secondary to glenohumeral osteoarthritis versus rotator cuff tendinopathy  We will schedule a complete right shoulder ultrasound to be done the morning of August 2.  Prior to that visit, patient will get x-rays of the right shoulder including an axillary view.  Once have had a chance to review those studies, then we will come up with a treatment plan.  In the meantime, he will stick with his 200 mg of ibuprofen as needed.  This note was dictated using Dragon  naturally speaking software and may contain errors in syntax, spelling, or content which have not been identified prior to signing this note.

## 2022-08-27 ENCOUNTER — Other Ambulatory Visit: Payer: Self-pay

## 2022-08-27 ENCOUNTER — Ambulatory Visit (INDEPENDENT_AMBULATORY_CARE_PROVIDER_SITE_OTHER): Payer: 59 | Admitting: Sports Medicine

## 2022-08-27 VITALS — BP 122/68 | Ht 74.0 in | Wt 205.0 lb

## 2022-08-27 DIAGNOSIS — M25511 Pain in right shoulder: Secondary | ICD-10-CM

## 2022-08-27 MED ORDER — METHYLPREDNISOLONE ACETATE 40 MG/ML IJ SUSP
40.0000 mg | Freq: Once | INTRAMUSCULAR | Status: AC
  Administered 2022-08-27: 40 mg via INTRA_ARTICULAR

## 2022-08-27 NOTE — Progress Notes (Signed)
PCP: Marden Noble, MD (Inactive)  SUBJECTIVE:   HPI:  Patient is a 67 y.o. male here for right shoulder ultrasound and injection.  Pain and symptomatology unchanged from prior OV. He has been continuing home exercises prescribed by his PCP.  ROS:     See HPI  PERTINENT  PMH / PSH FH / / SH:  Past Medical, Surgical, Social, and Family History Reviewed & Updated in the EMR.  Pertinent findings include:  HTN  Allergies  Allergen Reactions   Amoxicillin    Polycitra-K [Potassium Citrate]    Sulfa Antibiotics    Toprol Xl [Metoprolol Tartrate]    Vicodin [Hydrocodone-Acetaminophen]    Zetia [Ezetimibe]     OBJECTIVE:  BP 122/68   Ht 6\' 2"  (1.88 m)   Wt 205 lb (93 kg)   BMI 26.32 kg/m   PHYSICAL EXAM:  GEN: Alert and Oriented, NAD, comfortable in exam room RESP: Unlabored respirations, symmetric chest rise PSY: Normal mood, congruent affect   MSK Complete US of Shoulder, Right Patient was seated on exam table and shoulder US examination was performed using high frequency linear probe.  - The biceps tendon was visualized within the bicipital groove in both longitudinal and transverse axis with some signs of fluid and hypoechoic changes deep to the tendon. Tendon fibers intact without signs of irregularity.  -The subscapularis tendon was visualized in both longitudinal and transverse axis with intact tendon inserting at the inferior lesser tubercle of the humerus. No signs of tear, no hypoechoic changes or tissue irregularity were seen.  -The supraspinatus tendon was visualized in longitudinal and transverse views. No obvious tears noted at the superior facet of the greater tubercle of the humerus though do note calcific deposit in tendon and mild hypoechoic changes and tissue irregularity seen at footplate.  -The infraspinatus and teres minor tendons were visualized in both longitudinal and transverse axis with tendon insertion at the middle facet of the great tubercle of the  humerus with no signs of tearing, hypoechoic changes or tissue irregularity seen.  -The posterior glenohumeral joint was visualized with proper alignment and no appreciable cortical irregularity.  -The AC Joint was visualized without bursal distension/geyser sign though small bone spurs/arthritic changes appreciated.   IMPRESSION: Chronic appearing tendinopathy of supraspinatus with enthesophyte formation and mild tendinopathy of long head of biceps tendon. Normal appearing GH joint.  Ultrasound performed and interpreted by Glean Salen, MD under supervision of Frazier Butt, DO.   ASSESSMENT & PLAN:  Rotator Cuff Tendinopathy, right shoulder  Chronic appearing tendinopathy with intensified formation of supraspinatus noted on limited diagnostic ultrasound of the right shoulder today.  Patient failing to improve with conservative management and amenable to corticosteroid injection today which was performed without complication.  He was provided home exercise program and will follow-up with [place] in 4 to 6 weeks for reevaluation.  Procedure Documentation (Right Subacromial CSI): After informed written consent timeout was performed, patient was seated in chair in exam room. Right shoulder was prepped with alcohol swab and utilizing posterolateral approach, patient's left subacromial space was injected with 3:1 lidocaine: depomedrol. Patient tolerated the procedure well without immediate complications.  Glean Salen, MD PGY-4, Sports Medicine Fellow [facility]  Patient seen and evaluated with the sports medicine fellow.  I agree with the above plan of care.  I think this patient symptoms are originating from his supraspinatus calcific tendinopathy.  Subacromial cortisone injection performed as above.  He will couple this with home exercises.  If symptoms  persist despite today's injection then I would recommend further diagnostic imaging in the form of MRI.  He will follow-up  with [place] again in approximately 4 weeks.

## 2022-08-27 NOTE — Patient Instructions (Signed)
You have rotator cuff tendinopathy of supraspinatus and infraspinatus Try to avoid painful activities (overhead activities, lifting with extended arm) as much as possible. Aleve 2 tabs twice a day with food OR ibuprofen 3 tabs three times a day with food for pain and inflammation as needed. Can take tylenol in addition to this. Subacromial injection performed today to help with pain and to decrease inflammation. Do home exercise program with theraband and scapular stabilization exercises daily 3 sets of 10 once a day. If not improving at follow-up we will consider further imaging, injection, physical therapy, and/or nitro patches. Follow up with me in 4-6 weeks.

## 2022-10-05 ENCOUNTER — Ambulatory Visit (INDEPENDENT_AMBULATORY_CARE_PROVIDER_SITE_OTHER): Payer: 59 | Admitting: Sports Medicine

## 2022-10-05 VITALS — BP 124/70 | Ht 74.0 in | Wt 195.0 lb

## 2022-10-05 DIAGNOSIS — M25511 Pain in right shoulder: Secondary | ICD-10-CM

## 2022-10-05 NOTE — Progress Notes (Deleted)
PCP: Marden Noble, MD  Subjective:   HPI: Patient is a 67 y.o. male here for ***.  ***  Past Medical History:  Diagnosis Date   Hypertension    Prostate cancer [facility])     Current Outpatient Medications on File Prior to Visit  Medication Sig Dispense Refill   Cholecalciferol (VITAMIN D3) 2000 units TABS Take by mouth.     ciprofloxacin (CIPRO) 500 MG tablet Take 1 tablet (500 mg total) by mouth 2 (two) times daily. 6 tablet 0   cyclobenzaprine (FLEXERIL) 10 MG tablet 1 TABLET AS NEEDED THREE TIMES A DAY ORALLY 5 DAYS  0   docusate sodium (COLACE) 100 MG capsule Take 2 capsules (200 mg total) by mouth 2 (two) times daily. 120 capsule 3   lisinopril (PRINIVIL,ZESTRIL) 5 MG tablet Take 5 mg by mouth daily.     metFORMIN (GLUCOPHAGE) 500 MG tablet Take 500 mg by mouth 2 (two) times daily.  2   ondansetron (ZOFRAN ODT) 8 MG disintegrating tablet Take 1 tablet (8 mg total) by mouth every 6 (six) hours as needed for nausea or vomiting. 4 tablet 3   oxyCODONE-acetaminophen (PERCOCET) 10-325 MG tablet Take 1 tablet by mouth every 4 (four) hours as needed for pain. Maximum dose per 24 hours - 8 pills 20 tablet 0   predniSONE (STERAPRED UNI-PAK 21 TAB) 10 MG (21) TBPK tablet TAKE 6 TABLETS ON DAY 1 AS DIRECTED ON PACKAGE AND DECREASE BY 1 TAB EACH DAY FOR A TOTAL OF 6 DAYS  0   No current facility-administered medications on file prior to visit.    Past Surgical History:  Procedure Laterality Date   EXTRACORPOREAL SHOCK WAVE LITHOTRIPSY     EXTRACORPOREAL SHOCK WAVE LITHOTRIPSY Left 12/29/2017   Procedure: EXTRACORPOREAL SHOCK WAVE LITHOTRIPSY (ESWL);  Surgeon: Orson Ape, MD;  Location: [facility] ORS;  Service: Urology;  Laterality: Left;   kidney stents     PROSTATE BIOPSY     PROSTATE BIOPSY      Allergies  Allergen Reactions   Amoxicillin    Polycitra-K [Potassium Citrate]    Sulfa Antibiotics    Toprol Xl [Metoprolol Tartrate]    Vicodin [Hydrocodone-Acetaminophen]    Zetia  [Ezetimibe]     BP 124/70   Ht 6\' 2"  (1.88 m)   Wt 195 lb (88.5 kg)   BMI 25.04 kg/m       No data to display              No data to display              Objective:  Physical Exam:  Gen: NAD, comfortable in exam room  ***   Assessment & Plan:  1. ***

## 2022-10-05 NOTE — Progress Notes (Signed)
   Subjective:    Patient ID: Carlos Weber, male    DOB: 11-Apr-1955, 67 y.o.   MRN: [id number]  HPI  Carlos Weber presents today for follow-up on right shoulder pain.  He is 75% improved after recent subacromial cortisone injection.  X-rays and ultrasound both showed calcific supraspinatus tendinopathy.  He is pleased with his progress to date.  Minimal nighttime symptoms.  Review of Systems As above    Objective:   Physical Exam  Well-developed, well-nourished.  No acute distress  Right shoulder: Still some limitations in forward flexion and abduction but improved over his previous visit.  No tenderness to palpation.  Rotator cuff strength remains 5/5.  Neurovascularly intact distally.      Assessment & Plan:   Improved right shoulder pain secondary to calcific tendinopathy  No further workup or treatment indicated at this time.  He may continue with activity as tolerated.  We did discuss the possibility of repeat cortisone injection at a later date if symptoms once again worsen.  Follow-up as needed.  This note was dictated using Dragon naturally speaking software and may contain errors in syntax, spelling, or content which have not been identified prior to signing this note.

## 2023-06-10 ENCOUNTER — Other Ambulatory Visit (HOSPITAL_BASED_OUTPATIENT_CLINIC_OR_DEPARTMENT_OTHER): Payer: Self-pay | Admitting: Internal Medicine

## 2023-06-10 DIAGNOSIS — E782 Mixed hyperlipidemia: Secondary | ICD-10-CM

## 2023-06-22 LAB — COLOGUARD: COLOGUARD: NEGATIVE

## 2023-06-23 ENCOUNTER — Ambulatory Visit (HOSPITAL_BASED_OUTPATIENT_CLINIC_OR_DEPARTMENT_OTHER)
Admission: RE | Admit: 2023-06-23 | Discharge: 2023-06-23 | Disposition: A | Discharge status: Home / Self Care | Payer: Self-pay | Source: Ambulatory Visit | Attending: Internal Medicine | Admitting: Internal Medicine

## 2023-06-23 DIAGNOSIS — E782 Mixed hyperlipidemia: Secondary | ICD-10-CM | POA: Insufficient documentation

## 2023-08-12 ENCOUNTER — Encounter: Payer: Self-pay | Admitting: Advanced Practice Midwife
